# Patient Record
Sex: Male | Born: 1950 | Race: White | Hispanic: No | Marital: Married | Smoking: Never smoker
Health system: Southern US, Community
[De-identification: ages and names within clinical notes are randomized; demographics above are authoritative.]

## PROBLEM LIST (undated history)

## (undated) DIAGNOSIS — Z85828 Personal history of other malignant neoplasm of skin: Secondary | ICD-10-CM

## (undated) DIAGNOSIS — E785 Hyperlipidemia, unspecified: Secondary | ICD-10-CM

## (undated) DIAGNOSIS — T7840XA Allergy, unspecified, initial encounter: Secondary | ICD-10-CM

## (undated) DIAGNOSIS — I10 Essential (primary) hypertension: Secondary | ICD-10-CM

## (undated) DIAGNOSIS — K219 Gastro-esophageal reflux disease without esophagitis: Secondary | ICD-10-CM

## (undated) HISTORY — DX: Hyperlipidemia, unspecified: E78.5

## (undated) HISTORY — PX: NO PAST SURGERIES: SHX2092

## (undated) HISTORY — DX: Allergy, unspecified, initial encounter: T78.40XA

## (undated) HISTORY — DX: Gastro-esophageal reflux disease without esophagitis: K21.9

## (undated) HISTORY — DX: Essential (primary) hypertension: I10

---
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
## 2008-06-23 DIAGNOSIS — R195 Other fecal abnormalities: Secondary | ICD-10-CM | POA: Insufficient documentation

## 2011-08-21 ENCOUNTER — Ambulatory Visit: Payer: Self-pay | Admitting: Family Medicine
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
## 2013-09-17 LAB — BASIC METABOLIC PANEL
BUN: 16 mg/dL (ref 4–21)
Creatinine: 0.9 mg/dL (ref 0.6–1.3)
GLUCOSE: 156 mg/dL
POTASSIUM: 4.7 mmol/L (ref 3.4–5.3)

## 2014-01-28 LAB — CBC AND DIFFERENTIAL
HCT: 45 % (ref 41–53)
HEMOGLOBIN: 15.3 g/dL (ref 13.5–17.5)
PLATELETS: 214 10*3/uL (ref 150–399)
WBC: 5.1 10*3/mL

## 2014-01-28 LAB — LIPID PANEL
CHOLESTEROL: 189 mg/dL (ref 0–200)
HDL: 48 mg/dL (ref 35–70)
LDL Cholesterol: 124 mg/dL
TRIGLYCERIDES: 83 mg/dL (ref 40–160)

## 2014-01-28 LAB — HEMOGLOBIN A1C: Hemoglobin A1C: 6.9

## 2014-01-28 LAB — BASIC METABOLIC PANEL: Sodium: 141 mmol/L (ref 137–147)

## 2014-01-28 LAB — TSH: TSH: 2.08 u[IU]/mL (ref 0.41–5.90)

## 2015-01-17 ENCOUNTER — Other Ambulatory Visit: Payer: Self-pay | Admitting: Family Medicine

## 2015-01-17 DIAGNOSIS — Z8042 Family history of malignant neoplasm of prostate: Secondary | ICD-10-CM | POA: Insufficient documentation

## 2015-01-17 DIAGNOSIS — J309 Allergic rhinitis, unspecified: Secondary | ICD-10-CM | POA: Insufficient documentation

## 2015-01-17 DIAGNOSIS — K219 Gastro-esophageal reflux disease without esophagitis: Secondary | ICD-10-CM | POA: Insufficient documentation

## 2015-01-17 DIAGNOSIS — E119 Type 2 diabetes mellitus without complications: Secondary | ICD-10-CM

## 2015-03-09 DIAGNOSIS — R413 Other amnesia: Secondary | ICD-10-CM | POA: Insufficient documentation

## 2015-03-09 DIAGNOSIS — E781 Pure hyperglyceridemia: Secondary | ICD-10-CM | POA: Insufficient documentation

## 2015-03-09 DIAGNOSIS — E785 Hyperlipidemia, unspecified: Secondary | ICD-10-CM | POA: Insufficient documentation

## 2015-03-09 DIAGNOSIS — R000 Tachycardia, unspecified: Secondary | ICD-10-CM | POA: Insufficient documentation

## 2015-03-10 ENCOUNTER — Encounter: Payer: Self-pay | Admitting: Family Medicine

## 2015-03-10 ENCOUNTER — Ambulatory Visit (INDEPENDENT_AMBULATORY_CARE_PROVIDER_SITE_OTHER): Payer: BC Managed Care – PPO | Admitting: Family Medicine

## 2015-03-10 VITALS — BP 132/76 | HR 92 | Temp 97.7°F | Resp 16 | Ht 68.0 in | Wt 163.0 lb

## 2015-03-10 DIAGNOSIS — E119 Type 2 diabetes mellitus without complications: Secondary | ICD-10-CM | POA: Diagnosis not present

## 2015-03-10 DIAGNOSIS — E785 Hyperlipidemia, unspecified: Secondary | ICD-10-CM | POA: Diagnosis not present

## 2015-03-10 DIAGNOSIS — I10 Essential (primary) hypertension: Secondary | ICD-10-CM | POA: Diagnosis not present

## 2015-03-10 DIAGNOSIS — Z23 Encounter for immunization: Secondary | ICD-10-CM | POA: Diagnosis not present

## 2015-03-10 DIAGNOSIS — Z8042 Family history of malignant neoplasm of prostate: Secondary | ICD-10-CM | POA: Diagnosis not present

## 2015-03-10 DIAGNOSIS — K219 Gastro-esophageal reflux disease without esophagitis: Secondary | ICD-10-CM

## 2015-03-10 DIAGNOSIS — Z0000 Encounter for general adult medical examination without abnormal findings: Secondary | ICD-10-CM | POA: Diagnosis not present

## 2015-03-10 LAB — POCT UA - MICROALBUMIN: MICROALBUMIN (UR) POC: NEGATIVE mg/L

## 2015-03-10 MED ORDER — OMEPRAZOLE 20 MG PO CPDR: 20.0000 mg | DELAYED_RELEASE_CAPSULE | Freq: Two times a day (BID) | ORAL | Status: DC

## 2015-03-10 NOTE — Progress Notes (Signed)
Patient: Ryan Chambers, Male    DOB: 1951-02-12, 65 y.o.   MRN: [id number] Visit Date: 03/10/2015  Today's Provider: Lorie Phenix, MD   Chief Complaint  Patient presents with  . Annual Exam  . Diabetes   Subjective:    Annual physical exam Ryan Chambers is a 65 y.o. male who presents today for health maintenance and complete physical. He feels fairly well. He reports exercising daily; very active with landscaping job. He reports he is sleeping well.  Last OV/CPE was 01/10/2014. Labs were checked 01/28/2014. 10 year risk of heart disease was 24%. Pt started Pravastatin 20 mg. Last PSA- 01/28/2014- 0.4 (family h/o prostate ca) Last Colonoscopy- 10/16/2009 at [facility]; Dr. Earlean Polka. Hyperplastic polyp. Repeat 10 years Last Tdap- 06/03/2011.  ----------------------------------------------------------------- Diabetes Mellitus: Patient presents for follow up of diabetes. Symptoms: none. Symptoms have stabilized. Patient denies foot ulcerations, hypoglycemia , increase appetite, nausea, paresthesia of the feet, polydipsia, polyuria, visual disturbances and weight loss.  Evaluation to date has been included: hemoglobin A1C (last checked on 01/28/2014 and was 6.9%). Home sugars: Fasting BGs range between 105 and 140s. Treatment to date: Metformin 1000 mg BID.     Review of Systems  HENT: Positive for congestion. Negative for dental problem, drooling, ear discharge, ear pain, facial swelling, hearing loss, mouth sores, nosebleeds, postnasal drip, rhinorrhea, sinus pressure, sneezing, sore throat, tinnitus, trouble swallowing and voice change.   All other systems reviewed and are negative.   Social History      He  reports that he has never smoked. He has never used smokeless tobacco. He reports that he does not drink alcohol or use illicit drugs.       Social History   Social History  . Marital Status: Married    Spouse Name: Olegario Messier  . Number of Children: 3  . Years of  Education: college   Occupational History  .  Other    Self Employed- Scientist, water quality   Social History Main Topics  . Smoking status: Never Smoker   . Smokeless tobacco: Never Used  . Alcohol Use: No  . Drug Use: No  . Sexual Activity: Yes   Other Topics Concern  . None   Social History Narrative    Past Medical History  Diagnosis Date  . Allergy   . Hyperlipidemia   . Hypertension   . GERD (gastroesophageal reflux disease)      Patient Active Problem List   Diagnosis Date Noted  . HLD (hyperlipidemia) 03/09/2015  . Hypertriglyceridemia 03/09/2015  . Amnesia 03/09/2015  . Fast heart beat 03/09/2015  . Acid reflux 01/17/2015  . Allergic rhinitis 01/17/2015  . Family history of malignant neoplasm of prostate 01/17/2015  . Abnormal findings in stool 06/23/2008  . Type 2 diabetes mellitus (HCC) 08/13/2006  . BP (high blood pressure) 08/13/2006    Past Surgical History  Procedure Laterality Date  . No past surgeries      Family History        Family Status  Relation Status Death Age  . Father Deceased     pneumonia while on chemo  . Sister Alive   . Mother Deceased     liver problems        His family history includes Atrial fibrillation in his mother; Healthy in his sister; Prostate cancer in his father.    No Known Allergies  Previous Medications   ASPIRIN 81 MG TABLET    Take by mouth.  FLUTICASONE (FLONASE ALLERGY RELIEF) 50 MCG/ACT NASAL SPRAY    Place into the nose. Reported on 03/10/2015   GLUCOSE BLOOD (FREESTYLE LITE) TEST STRIP    FREESTYLE LITE TEST (In Vitro Strip)  1 Strip once daily for 0 days  Quantity: 100;  Refills: 3   Ordered :15-Apr-2013  Lorie Phenix MD;  Started 15-Apr-2013 Active   LOSARTAN (COZAAR) 50 MG TABLET    Take by mouth.   METFORMIN (GLUCOPHAGE) 1000 MG TABLET    TAKE ONE TABLET BY MOUTH TWICE DAILY   MULTIPLE VITAMIN PO    Take by mouth.   PRAVASTATIN (PRAVACHOL) 20 MG TABLET    Take by mouth.   ZINC-VITAMIN C  (ZINC + VITAMIN C) 20-120 MG LOZG    Use as directed in the mouth or throat.    Patient Care Team: Lorie Phenix, MD as PCP - General (Family Medicine)     Objective:   Vitals: BP 132/76 mmHg  Pulse 92  Temp(Src) 97.7 F (36.5 C) (Oral)  Resp 16  Ht  (1.727 m)  Wt 163 lb (73.936 kg)  BMI 24.79 kg/m2   Physical Exam  Constitutional: He appears well-developed and well-nourished.  HENT:  Head: Normocephalic and atraumatic.  Right Ear: External ear normal.  Left Ear: External ear normal.  Nose: Nose normal.  Mouth/Throat: Oropharynx is clear and moist. No oropharyngeal exudate.  Eyes: Conjunctivae and EOM are normal. Pupils are equal, round, and reactive to light.  Neck: Normal range of motion. Neck supple. No thyromegaly present.  Cardiovascular: Normal rate, regular rhythm and intact distal pulses.  Exam reveals no gallop and no friction rub.   No murmur heard. Pulmonary/Chest: Effort normal and breath sounds normal. No respiratory distress. He has no wheezes. He has no rales. He exhibits no tenderness.  Abdominal: Soft. Bowel sounds are normal.  Musculoskeletal: Normal range of motion. He exhibits no edema.  Neurological: He is alert.  Monofilament normal  Skin: Skin is warm and dry.  Psychiatric: He has a normal mood and affect. His behavior is normal.     Depression Screen PHQ 2/9 Scores 03/10/2015  PHQ - 2 Score 0      Assessment & Plan:     Routine Health Maintenance and Physical Exam  Exercise Activities and Dietary recommendations Goals    None      Immunization History  Administered Date(s) Administered  . Hepatitis B 02/01/2011, 03/04/2011, 09/02/2011  . Influenza,inj,Quad PF,36+ Mos 03/10/2015  . Pneumococcal Polysaccharide-23 02/01/2011  . Td 06/03/2011  . Tdap 06/03/2011    Health Maintenance  Topic Date Due  . Hepatitis C Screening  [birth year]  . FOOT EXAM  04/29/1962  . OPHTHALMOLOGY EXAM  04/29/1962  . HIV Screening  04/29/1967    . COLONOSCOPY  04/29/2002  . ZOSTAVAX  04/28/2012  . HEMOGLOBIN A1C  07/30/2014  . INFLUENZA VACCINE  09/19/2014  . PNEUMOCOCCAL POLYSACCHARIDE VACCINE (2) 02/01/2016  . TETANUS/TDAP  06/02/2021      Discussed health benefits of physical activity, and encouraged him to engage in regular exercise appropriate for his age and condition.    --------------------------------------------------------------------  1. Annual physical exam Stable. As above.  2. Type 2 diabetes mellitus without complication, without long-term current use of insulin (HCC) A1C not checked since December, 2015. Order labs as below. Strongly encouraged pt to FU every 6 months, or sooner if needed.  Results for orders placed or performed in visit on 03/10/15  POCT UA - Microalbumin  Result Value Ref  Range   Microalbumin Ur, POC Negative mg/L   Diabetic Foot Exam - Simple   Simple Foot Form  Diabetic Foot exam was performed with the following findings:  Yes 03/10/2015 12:13 PM  Visual Inspection  No deformities, no ulcerations, no other skin breakdown bilaterally:  Yes  Sensation Testing  Intact to touch and monofilament testing bilaterally:  Yes  Pulse Check  Posterior Tibialis and Dorsalis pulse intact bilaterally:  Yes  Comments     - POCT UA - Microalbumin - Hemoglobin A1c  3. Essential hypertension Stable. FU pending lab results. - Comprehensive metabolic panel - CBC with Differential/Platelet  4. HLD (hyperlipidemia) Pt has H/O this. FU pending labs. - Lipid panel - TSH  5. Flu vaccine need Administered today. - Flu Vaccine QUAD 36+ mos IM  6. Family history of malignant neoplasm of prostate FU pending lab results. - PSA  7. Gastroesophageal reflux disease without esophagitis Discontinue Nexium due to insurance. Start Omeprazole 20 mg BID AC as below. - omeprazole (PRILOSEC) 20 MG capsule; Take 1 capsule (20 mg total) by mouth 2 (two) times daily before a meal.  Dispense: 180 capsule;  Refill: 3   Patient seen and examined by Leo Grosser, MD, and note scribed by Allene Dillon, CMA.  I have reviewed the document for accuracy and completeness and I agree with above. Leo Grosser, MD   Lorie Phenix, MD

## 2015-03-17 ENCOUNTER — Encounter: Payer: Self-pay | Admitting: Family Medicine

## 2015-03-21 ENCOUNTER — Telehealth: Payer: Self-pay

## 2015-03-21 LAB — COMPREHENSIVE METABOLIC PANEL
A/G RATIO: 2.2 (ref 1.1–2.5)
ALT: 20 IU/L (ref 0–44)
AST: 16 IU/L (ref 0–40)
Albumin: 4.3 g/dL (ref 3.6–4.8)
Alkaline Phosphatase: 78 IU/L (ref 39–117)
BUN/Creatinine Ratio: 14 (ref 10–22)
BUN: 14 mg/dL (ref 8–27)
Bilirubin Total: 0.6 mg/dL (ref 0.0–1.2)
CALCIUM: 9.4 mg/dL (ref 8.6–10.2)
CO2: 23 mmol/L (ref 18–29)
Chloride: 100 mmol/L (ref 96–106)
Creatinine, Ser: 0.98 mg/dL (ref 0.76–1.27)
GFR, EST AFRICAN AMERICAN: 95 mL/min/{1.73_m2} (ref >59)
GFR, EST NON AFRICAN AMERICAN: 82 mL/min/{1.73_m2} (ref >59)
GLOBULIN, TOTAL: 2 g/dL (ref 1.5–4.5)
Glucose: 180 mg/dL — ABNORMAL HIGH (ref 65–99)
POTASSIUM: 3.9 mmol/L (ref 3.5–5.2)
SODIUM: 141 mmol/L (ref 134–144)
TOTAL PROTEIN: 6.3 g/dL (ref 6.0–8.5)

## 2015-03-21 LAB — CBC WITH DIFFERENTIAL/PLATELET
BASOS: 0 %
Basophils Absolute: 0 10*3/uL (ref 0.0–0.2)
EOS (ABSOLUTE): 0.1 10*3/uL (ref 0.0–0.4)
Eos: 2 %
Hematocrit: 42 % (ref 37.5–51.0)
Hemoglobin: 14.6 g/dL (ref 12.6–17.7)
Immature Grans (Abs): 0 10*3/uL (ref 0.0–0.1)
Immature Granulocytes: 1 %
LYMPHS ABS: 2 10*3/uL (ref 0.7–3.1)
Lymphs: 30 %
MCH: 29.6 pg (ref 26.6–33.0)
MCHC: 34.8 g/dL (ref 31.5–35.7)
MCV: 85 fL (ref 79–97)
MONOS ABS: 0.5 10*3/uL (ref 0.1–0.9)
Monocytes: 8 %
NEUTROS ABS: 3.9 10*3/uL (ref 1.4–7.0)
Neutrophils: 59 %
PLATELETS: 199 10*3/uL (ref 150–379)
RBC: 4.94 x10E6/uL (ref 4.14–5.80)
RDW: 13.7 % (ref 12.3–15.4)
WBC: 6.6 10*3/uL (ref 3.4–10.8)

## 2015-03-21 LAB — PSA: PROSTATE SPECIFIC AG, SERUM: 0.6 ng/mL (ref 0.0–4.0)

## 2015-03-21 LAB — HEMOGLOBIN A1C
Est. average glucose Bld gHb Est-mCnc: 174 mg/dL
Hgb A1c MFr Bld: 7.7 % — ABNORMAL HIGH (ref 4.8–5.6)

## 2015-03-21 LAB — LIPID PANEL
CHOL/HDL RATIO: 3.9 ratio (ref 0.0–5.0)
Cholesterol, Total: 139 mg/dL (ref 100–199)
HDL: 36 mg/dL — ABNORMAL LOW (ref >39)
LDL CALC: 79 mg/dL (ref 0–99)
TRIGLYCERIDES: 119 mg/dL (ref 0–149)
VLDL Cholesterol Cal: 24 mg/dL (ref 5–40)

## 2015-03-21 LAB — TSH: TSH: 2.2 u[IU]/mL (ref 0.450–4.500)

## 2015-03-21 MED ORDER — SITAGLIPTIN PHOSPHATE 100 MG PO TABS: 100.0000 mg | ORAL_TABLET | Freq: Every day | ORAL | Status: DC

## 2015-03-21 NOTE — Telephone Encounter (Signed)
-----   Message from Lorie Phenix, MD sent at 03/21/2015  7:11 AM EST ----- Labs stable except for blood sugar much too high. Recommend start Januvia and recheck ov in 3 months.  Please send in rx if patient agrees. Thanks.

## 2015-03-21 NOTE — Telephone Encounter (Signed)
Patient advised as below. Patient agrees to starting Januvia. Patient is requesting rx to be sent into [facility]

## 2015-04-18 ENCOUNTER — Other Ambulatory Visit: Payer: Self-pay | Admitting: Family Medicine

## 2015-04-18 DIAGNOSIS — E785 Hyperlipidemia, unspecified: Secondary | ICD-10-CM

## 2015-05-15 ENCOUNTER — Other Ambulatory Visit: Payer: Self-pay | Admitting: Family Medicine

## 2015-05-15 DIAGNOSIS — I10 Essential (primary) hypertension: Secondary | ICD-10-CM

## 2015-06-16 ENCOUNTER — Ambulatory Visit (INDEPENDENT_AMBULATORY_CARE_PROVIDER_SITE_OTHER): Payer: BC Managed Care – PPO | Admitting: Family Medicine

## 2015-06-16 ENCOUNTER — Encounter: Payer: Self-pay | Admitting: Family Medicine

## 2015-06-16 VITALS — BP 120/72 | HR 92 | Temp 97.6°F | Resp 16 | Wt 162.0 lb

## 2015-06-16 DIAGNOSIS — I10 Essential (primary) hypertension: Secondary | ICD-10-CM | POA: Diagnosis not present

## 2015-06-16 DIAGNOSIS — E785 Hyperlipidemia, unspecified: Secondary | ICD-10-CM

## 2015-06-16 DIAGNOSIS — E119 Type 2 diabetes mellitus without complications: Secondary | ICD-10-CM

## 2015-06-16 DIAGNOSIS — K219 Gastro-esophageal reflux disease without esophagitis: Secondary | ICD-10-CM | POA: Diagnosis not present

## 2015-06-16 LAB — POCT GLYCOSYLATED HEMOGLOBIN (HGB A1C)
Est. average glucose Bld gHb Est-mCnc: 143
Hemoglobin A1C: 6.6

## 2015-06-16 MED ORDER — GLUCOSE BLOOD VI STRP: 1.0000 | ORAL_STRIP | Freq: Every day | Status: DC

## 2015-06-16 MED ORDER — METFORMIN HCL 1000 MG PO TABS: 1000.0000 mg | ORAL_TABLET | Freq: Two times a day (BID) | ORAL | Status: DC

## 2015-06-16 MED ORDER — PRAVASTATIN SODIUM 20 MG PO TABS: 20.0000 mg | ORAL_TABLET | Freq: Every evening | ORAL | Status: DC

## 2015-06-16 MED ORDER — LOSARTAN POTASSIUM 50 MG PO TABS: 50.0000 mg | ORAL_TABLET | Freq: Every day | ORAL | Status: DC

## 2015-06-16 MED ORDER — OMEPRAZOLE 20 MG PO CPDR: 20.0000 mg | DELAYED_RELEASE_CAPSULE | Freq: Two times a day (BID) | ORAL | Status: DC

## 2015-06-16 MED ORDER — SITAGLIPTIN PHOSPHATE 100 MG PO TABS: 100.0000 mg | ORAL_TABLET | Freq: Every day | ORAL | Status: DC

## 2015-06-16 NOTE — Progress Notes (Signed)
Subjective:    Patient ID: Ryan Chambers, male    DOB: 1950/03/31, 65 y.o.   MRN: [id number]  Diabetes He presents for his follow-up (Last A1C was 03/20/2015 and was 7.7%. Januvia started since LOV) diabetic visit. He has type 2 diabetes mellitus. There are no hypoglycemic associated symptoms. Associated symptoms include fatigue (stable). Pertinent negatives for diabetes include no blurred vision, no chest pain, no foot paresthesias, no foot ulcerations, no polydipsia, no polyphagia, no polyuria, no visual change and no weakness. Symptoms are stable. Risk factors for coronary artery disease include diabetes mellitus, dyslipidemia, hypertension, male sex and family history. Current diabetic treatment includes oral agent (dual therapy) (Metformin 1000 mg BID, Januvia 100 mg po qd). He is compliant with treatment all of the time. He is following a generally healthy diet. He participates in exercise daily. Home blood sugar record trend: have not been checking BS due to being out strips. An ACE inhibitor/angiotensin II receptor blocker is being taken. Eye exam is current.  Hypertension This is a chronic problem. The problem is controlled. Pertinent negatives include no blurred vision, chest pain, neck pain, orthopnea, palpitations, peripheral edema or shortness of breath. Treatments tried: currently taking Losartan 50 mg. The current treatment provides moderate improvement. There are no compliance problems.       Review of Systems  Constitutional: Positive for fatigue (stable).  Eyes: Negative for blurred vision.  Respiratory: Negative for shortness of breath.   Cardiovascular: Negative for chest pain, palpitations and orthopnea.  Endocrine: Negative for polydipsia, polyphagia and polyuria.  Musculoskeletal: Negative for neck pain.  Neurological: Negative for weakness.   BP 120/72 mmHg  Pulse 92  Temp(Src) 97.6 F (36.4 C) (Oral)  Resp 16  Wt 162 lb (73.483 kg)   Patient Active Problem List   Diagnosis Date Noted  . HLD (hyperlipidemia) 03/09/2015  . Hypertriglyceridemia 03/09/2015  . Amnesia 03/09/2015  . Fast heart beat 03/09/2015  . Acid reflux 01/17/2015  . Allergic rhinitis 01/17/2015  . Family history of malignant neoplasm of prostate 01/17/2015  . Abnormal findings in stool 06/23/2008  . DM type 2 (diabetes mellitus, type 2) (HCC) 08/13/2006  . BP (high blood pressure) 08/13/2006   Past Medical History  Diagnosis Date  . Allergy   . Hyperlipidemia   . Hypertension   . GERD (gastroesophageal reflux disease)    Current Outpatient Prescriptions on File Prior to Visit  Medication Sig  . aspirin 81 MG tablet Take by mouth.  . losartan (COZAAR) 50 MG tablet TAKE 1 TABLET BY MOUTH EVERY DAY  . metFORMIN (GLUCOPHAGE) 1000 MG tablet TAKE ONE TABLET BY MOUTH TWICE DAILY  . MULTIPLE VITAMIN PO Take by mouth.  Marland Kitchen omeprazole (PRILOSEC) 20 MG capsule Take 1 capsule (20 mg total) by mouth 2 (two) times daily before a meal.  . pravastatin (PRAVACHOL) 20 MG tablet TAKE ONE TABLET BY MOUTH IN THE EVENING  . sitaGLIPtin (JANUVIA) 100 MG tablet Take 1 tablet (100 mg total) by mouth daily.  . Zinc-Vitamin C (ZINC + VITAMIN C) 20-120 MG LOZG Use as directed in the mouth or throat.  . fluticasone (FLONASE ALLERGY RELIEF) 50 MCG/ACT nasal spray Place into the nose. Reported on 06/16/2015   No current facility-administered medications on file prior to visit.   No Known Allergies Past Surgical History  Procedure Laterality Date  . No past surgeries     Social History   Social History  . Marital Status: Married    Spouse Name: Olegario Messier  .  Number of Children: 3  . Years of Education: college   Occupational History  .  Other    Self Employed- Scientist, [employer]   Social History Main Topics  . Smoking status: Never Smoker   . Smokeless tobacco: Never Used  . Alcohol Use: No  . Drug Use: No  . Sexual Activity: Yes   Other Topics Concern  . Not on file   Social History  Narrative   Family History  Problem Relation Age of Onset  . Prostate cancer Father   . Healthy Sister   . Atrial fibrillation Mother         Objective:   Physical Exam  Constitutional: He appears well-developed and well-nourished.  Cardiovascular: Normal rate, regular rhythm and normal heart sounds.   Pulmonary/Chest: Effort normal and breath sounds normal. No respiratory distress.  Psychiatric: He has a normal mood and affect. His behavior is normal.  BP 120/72 mmHg  Pulse 92  Temp(Src) 97.6 F (36.4 C) (Oral)  Resp 16  Wt 162 lb (73.483 kg)     Assessment & Plan:  1. Type 2 diabetes mellitus without complication, without long-term current use of insulin (HCC) Improving. Continue current medications and plan of care. Refill as below.  Results for orders placed or performed in visit on 06/16/15  POCT glycosylated hemoglobin (Hb A1C)  Result Value Ref Range   Hemoglobin A1C 6.6    Est. average glucose Bld gHb Est-mCnc 143     - POCT glycosylated hemoglobin (Hb A1C) - glucose blood (FREESTYLE LITE) test strip; 1 each by Other route daily. Use as instructed  Dispense: 100 each; Refill: 11 - metFORMIN (GLUCOPHAGE) 1000 MG tablet; Take 1 tablet (1,000 mg total) by mouth 2 (two) times daily.  Dispense: 180 tablet; Refill: 2 - sitaGLIPtin (JANUVIA) 100 MG tablet; Take 1 tablet (100 mg total) by mouth daily.  Dispense: 90 tablet; Refill: 2  2. Essential hypertension Stable. Continue current medications and plan of care. Refills provided. - losartan (COZAAR) 50 MG tablet; Take 1 tablet (50 mg total) by mouth daily.  Dispense: 90 tablet; Refill: 2  3. HLD (hyperlipidemia) Stable. Continue current medications and plan of care. Refills provided. - pravastatin (PRAVACHOL) 20 MG tablet; Take 1 tablet (20 mg total) by mouth every evening.  Dispense: 90 tablet; Refill: 2  4. Gastroesophageal reflux disease without esophagitis Stable. Continue current medications and plan of care.  Refills provided. - omeprazole (PRILOSEC) 20 MG capsule; Take 1 capsule (20 mg total) by mouth 2 (two) times daily before a meal.  Dispense: 180 capsule; Refill: 2    Patient seen and examined by Leo GrosserNancy J. Keenya Matera, MD, and note scribed by Allene DillonEmily Drozdowski, CMA.   I have reviewed the document for accuracy and completeness and I agree with above. Leo Grosser- Shakeda Pearse J. Ladonya Jerkins, MD   Lorie PhenixNancy Tilla Wilborn, MD

## 2015-09-08 ENCOUNTER — Ambulatory Visit: Payer: BC Managed Care – PPO | Admitting: Family Medicine

## 2015-12-04 ENCOUNTER — Ambulatory Visit (INDEPENDENT_AMBULATORY_CARE_PROVIDER_SITE_OTHER): Payer: BC Managed Care – PPO | Admitting: Family Medicine

## 2015-12-04 ENCOUNTER — Encounter: Payer: Self-pay | Admitting: Family Medicine

## 2015-12-04 VITALS — BP 128/82 | HR 94 | Temp 97.6°F | Resp 16 | Wt 162.8 lb

## 2015-12-04 DIAGNOSIS — E782 Mixed hyperlipidemia: Secondary | ICD-10-CM

## 2015-12-04 DIAGNOSIS — E119 Type 2 diabetes mellitus without complications: Secondary | ICD-10-CM

## 2015-12-04 DIAGNOSIS — I10 Essential (primary) hypertension: Secondary | ICD-10-CM | POA: Diagnosis not present

## 2015-12-04 NOTE — Progress Notes (Signed)
Patient: Ryan Chambers Male    DOB: [birth year]   65 y.o.   MRN: [id number] Visit Date: 12/04/2015  Today's Provider: Dortha Kernennis Skye Rodarte, PA   Chief Complaint  Patient presents with  . Hyperlipidemia  . Hypertension  . Diabetes  . Follow-up   Subjective:    HPI Patient is here for follow up and establish care with Maurine Ministerennis since Dr Elease HashimotoMaloney left. Patient is doing well on current medication regimen.     Diabetes Mellitus Type II, Follow-up:   Lab Results  Component Value Date   HGBA1C 6.6 06/16/2015   HGBA1C 7.7 (H) 03/20/2015   HGBA1C 6.9 01/28/2014   Last seen for diabetes 6 months ago.  Management since then includes continue medication. He reports excellent compliance with treatment. He is not having side effects.  Current symptoms include none and have been stable. Home blood sugar records: not being checked regularly  Episodes of hypoglycemia? unknown   Weight trend: stable Current diet: in general, a "healthy" diet   Current exercise: yard work  ------------------------------------------------------------------------   Hypertension, follow-up:  BP Readings from Last 3 Encounters:  12/04/15 128/82  06/16/15 120/72  03/10/15 132/76    He was last seen for hypertension 6 months ago.  BP at that visit was 120/72. Management since that visit includes continue medication.He reports excellent compliance with treatment. He is not having side effects.  He is exercising. He is adherent to low salt diet.   Outside blood pressures are not being checked. He is experiencing none.  Patient denies chest pain, irregular heart beat and palpitations.   Cardiovascular risk factors include advanced age (older than 3455 for men, 5765 for women), diabetes mellitus, dyslipidemia, hypertension and male gender.  Use of agents associated with hypertension: none.   ------------------------------------------------------------------------    Lipid/Cholesterol, Follow-up:   Last seen  for this 6 months ago.  Management since that visit includes continue medication.  Last Lipid Panel:    Component Value Date/Time   CHOL 139 03/20/2015 0829   TRIG 119 03/20/2015 0829   HDL 36 (L) 03/20/2015 0829   CHOLHDL 3.9 03/20/2015 0829   LDLCALC 79 03/20/2015 0829    He reports excellent compliance with treatment. He is not having side effects.   Wt Readings from Last 3 Encounters:  12/04/15 162 lb 12.8 oz (73.8 kg)  06/16/15 162 lb (73.5 kg)  03/10/15 163 lb (73.9 kg)    ------------------------------------------------------------------------  Previous Medications   ASPIRIN 81 MG TABLET    Take by mouth.   FLUTICASONE (FLONASE ALLERGY RELIEF) 50 MCG/ACT NASAL SPRAY    Place into the nose. Reported on 06/16/2015   GLUCOSE BLOOD (FREESTYLE LITE) TEST STRIP    1 each by Other route daily. Use as instructed   LOSARTAN (COZAAR) 50 MG TABLET    Take 1 tablet (50 mg total) by mouth daily.   METFORMIN (GLUCOPHAGE) 1000 MG TABLET    Take 1 tablet (1,000 mg total) by mouth 2 (two) times daily.   MULTIPLE VITAMIN PO    Take by mouth.   OMEPRAZOLE (PRILOSEC) 20 MG CAPSULE    Take 1 capsule (20 mg total) by mouth 2 (two) times daily before a meal.   PRAVASTATIN (PRAVACHOL) 20 MG TABLET    Take 1 tablet (20 mg total) by mouth every evening.   SITAGLIPTIN (JANUVIA) 100 MG TABLET    Take 1 tablet (100 mg total) by mouth daily.   ZINC-VITAMIN C (ZINC + VITAMIN C) 20-120 MG LOZG  Use as directed in the mouth or throat.    Review of Systems  Constitutional: Negative.   Respiratory: Negative.   Cardiovascular: Negative.   Endocrine: Negative.   Musculoskeletal: Negative.     Social History  Substance Use Topics  . Smoking status: Never Smoker  . Smokeless tobacco: Never Used  . Alcohol use No   Objective:   BP 128/82 (BP Location: Right Arm, Patient Position: Sitting, Cuff Size: Normal)   Pulse 94   Temp 97.6 F (36.4 C) (Oral)   Resp 16   Wt 162 lb 12.8 oz (73.8 kg)    BMI 24.75 kg/m   Physical Exam  Constitutional: He is oriented to person, place, and time. He appears well-developed and well-nourished. No distress.  HENT:  Head: Normocephalic and atraumatic.  Right Ear: Hearing normal.  Left Ear: Hearing normal.  Nose: Nose normal.  Eyes: Conjunctivae and lids are normal. Right eye exhibits no discharge. Left eye exhibits no discharge. No scleral icterus.  Neck: Neck supple. No thyromegaly present.  Cardiovascular: Normal rate and regular rhythm.   Pulmonary/Chest: Effort normal and breath sounds normal. No respiratory distress.  Abdominal: Soft. Bowel sounds are normal.  Musculoskeletal: Normal range of motion.  Neurological: He is alert and oriented to person, place, and time.  Skin: Skin is intact. No lesion and no rash noted.  Psychiatric: He has a normal mood and affect. His speech is normal and behavior is normal. Thought content normal.      Assessment & Plan:     1. Type 2 diabetes mellitus without complication, without long-term current use of insulin (HCC) Stable. Tolerating Januvia and Metformin without hypoglycemic episodes. Had eye exam March 2017. Monofilament exam and microalbuminuria tests were normal 03-10-15. 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 Recheck labs and schedule physical in 3-4 months. - CBC with Differential/Platelet - Comprehensive metabolic panel - Lipid panel - Hemoglobin A1c  2. Mixed hyperlipidemia Stable and tolerating Pravastatin 20 mg qd with low fat diet restrictions. Recheck labs and follow up pending reports. Denies side effects from medications. - Comprehensive metabolic panel - Lipid panel - TSH  3. Essential hypertension Well controlled BP. Tolerating Losartan 50 mg qd without chest pains, palpitations, dyspnea or edema. Recheck labs and follow up pending reports.

## 2015-12-13 ENCOUNTER — Telehealth: Payer: Self-pay

## 2015-12-13 LAB — CBC WITH DIFFERENTIAL/PLATELET
BASOS: 0 %
Basophils Absolute: 0 10*3/uL (ref 0.0–0.2)
EOS (ABSOLUTE): 0.1 10*3/uL (ref 0.0–0.4)
EOS: 1 %
Hematocrit: 42.9 % (ref 37.5–51.0)
Hemoglobin: 14.5 g/dL (ref 12.6–17.7)
IMMATURE GRANS (ABS): 0 10*3/uL (ref 0.0–0.1)
IMMATURE GRANULOCYTES: 1 %
LYMPHS: 31 %
Lymphocytes Absolute: 1.9 10*3/uL (ref 0.7–3.1)
MCH: 28.8 pg (ref 26.6–33.0)
MCHC: 33.8 g/dL (ref 31.5–35.7)
MCV: 85 fL (ref 79–97)
Monocytes Absolute: 0.5 10*3/uL (ref 0.1–0.9)
Monocytes: 8 %
NEUTROS PCT: 59 %
Neutrophils Absolute: 3.7 10*3/uL (ref 1.4–7.0)
PLATELETS: 202 10*3/uL (ref 150–379)
RBC: 5.03 x10E6/uL (ref 4.14–5.80)
RDW: 13.9 % (ref 12.3–15.4)
WBC: 6.3 10*3/uL (ref 3.4–10.8)

## 2015-12-13 LAB — LIPID PANEL
CHOL/HDL RATIO: 3.7 ratio (ref 0.0–5.0)
Cholesterol, Total: 151 mg/dL (ref 100–199)
HDL: 41 mg/dL (ref >39)
LDL CALC: 86 mg/dL (ref 0–99)
Triglycerides: 122 mg/dL (ref 0–149)
VLDL CHOLESTEROL CAL: 24 mg/dL (ref 5–40)

## 2015-12-13 LAB — COMPREHENSIVE METABOLIC PANEL
A/G RATIO: 2.4 — AB (ref 1.2–2.2)
ALT: 16 IU/L (ref 0–44)
AST: 16 IU/L (ref 0–40)
Albumin: 4.3 g/dL (ref 3.6–4.8)
Alkaline Phosphatase: 69 IU/L (ref 39–117)
BUN/Creatinine Ratio: 14 (ref 10–24)
BUN: 12 mg/dL (ref 8–27)
Bilirubin Total: 0.4 mg/dL (ref 0.0–1.2)
CALCIUM: 10.2 mg/dL (ref 8.6–10.2)
CO2: 25 mmol/L (ref 18–29)
CREATININE: 0.86 mg/dL (ref 0.76–1.27)
Chloride: 100 mmol/L (ref 96–106)
GFR, EST AFRICAN AMERICAN: 107 mL/min/{1.73_m2} (ref >59)
GFR, EST NON AFRICAN AMERICAN: 92 mL/min/{1.73_m2} (ref >59)
Globulin, Total: 1.8 g/dL (ref 1.5–4.5)
Glucose: 161 mg/dL — ABNORMAL HIGH (ref 65–99)
Potassium: 4.5 mmol/L (ref 3.5–5.2)
Sodium: 140 mmol/L (ref 134–144)
TOTAL PROTEIN: 6.1 g/dL (ref 6.0–8.5)

## 2015-12-13 LAB — TSH: TSH: 2.47 u[IU]/mL (ref 0.450–4.500)

## 2015-12-13 LAB — HEMOGLOBIN A1C
Est. average glucose Bld gHb Est-mCnc: 157 mg/dL
HEMOGLOBIN A1C: 7.1 % — AB (ref 4.8–5.6)

## 2015-12-13 NOTE — Telephone Encounter (Signed)
LMTCB

## 2015-12-13 NOTE — Telephone Encounter (Signed)
-----   Message from Tamsen Roersennis E Chrismon, [place] sent at 12/13/2015  2:11 PM EDT ----- Very good cholesterol levels. Blood sugar elevated but better than last time. Hgb A1C slightly higher than last check but in diabetes control range. Normal thyroid and now sign of anemia or infection. Continue present medication and diet regimen. Schedule for CPE in the next 3-4 months.

## 2015-12-13 NOTE — Telephone Encounter (Signed)
Patient advised. Patient has appointment scheduled for CPE.

## 2016-01-25 DIAGNOSIS — Z85828 Personal history of other malignant neoplasm of skin: Secondary | ICD-10-CM

## 2016-01-25 HISTORY — DX: Personal history of other malignant neoplasm of skin: Z85.828

## 2016-03-11 ENCOUNTER — Ambulatory Visit (INDEPENDENT_AMBULATORY_CARE_PROVIDER_SITE_OTHER): Payer: BC Managed Care – PPO | Admitting: Family Medicine

## 2016-03-11 ENCOUNTER — Encounter: Payer: Self-pay | Admitting: Family Medicine

## 2016-03-11 VITALS — BP 138/78 | HR 100 | Temp 98.0°F | Resp 18 | Ht 68.0 in | Wt 161.4 lb

## 2016-03-11 DIAGNOSIS — Z0000 Encounter for general adult medical examination without abnormal findings: Secondary | ICD-10-CM | POA: Diagnosis not present

## 2016-03-11 DIAGNOSIS — Z1211 Encounter for screening for malignant neoplasm of colon: Secondary | ICD-10-CM | POA: Diagnosis not present

## 2016-03-11 DIAGNOSIS — E119 Type 2 diabetes mellitus without complications: Secondary | ICD-10-CM | POA: Diagnosis not present

## 2016-03-11 DIAGNOSIS — S46911A Strain of unspecified muscle, fascia and tendon at shoulder and upper arm level, right arm, initial encounter: Secondary | ICD-10-CM

## 2016-03-11 DIAGNOSIS — I10 Essential (primary) hypertension: Secondary | ICD-10-CM

## 2016-03-11 DIAGNOSIS — Z8042 Family history of malignant neoplasm of prostate: Secondary | ICD-10-CM | POA: Diagnosis not present

## 2016-03-11 DIAGNOSIS — E782 Mixed hyperlipidemia: Secondary | ICD-10-CM | POA: Diagnosis not present

## 2016-03-11 DIAGNOSIS — Z114 Encounter for screening for human immunodeficiency virus [HIV]: Secondary | ICD-10-CM

## 2016-03-11 DIAGNOSIS — Z1159 Encounter for screening for other viral diseases: Secondary | ICD-10-CM

## 2016-03-11 LAB — IFOBT (OCCULT BLOOD): IFOBT: NEGATIVE

## 2016-03-11 LAB — POCT UA - MICROALBUMIN: Microalbumin Ur, POC: 20 mg/L

## 2016-03-11 NOTE — Progress Notes (Signed)
Patient: Ryan Chambers, Male    DOB: [birth year], 66 y.o.   MRN: [id number] Visit Date: 03/11/2016  Today's Provider: Dortha Kern, PA   Chief Complaint  Patient presents with  . Annual Exam   Subjective:    Annual physical exam Ryan Chambers is a 66 y.o. male who presents today for health maintenance and complete physical. He feels fairly well. He reports exercising. He reports he is sleeping fair 5-6 hours per night. Patient would like to discuss sleep disturbance.  ---------------------------------------------------------------- Patient Active Problem List   Diagnosis Date Noted  . HLD (hyperlipidemia) 03/09/2015  . Hypertriglyceridemia 03/09/2015  . Amnesia 03/09/2015  . Fast heart beat 03/09/2015  . Acid reflux 01/17/2015  . Allergic rhinitis 01/17/2015  . Family history of malignant neoplasm of prostate 01/17/2015  . Abnormal findings in stool 06/23/2008  . DM type 2 (diabetes mellitus, type 2) (HCC) 08/13/2006  . BP (high blood pressure) 08/13/2006    Review of Systems  Constitutional: Negative.   Respiratory: Negative.   Cardiovascular: Negative.   Musculoskeletal:       Right shoulder pain   Psychiatric/Behavioral: Positive for sleep disturbance.    Social History      He  reports that he has never smoked. He has never used smokeless tobacco. He reports that he does not drink alcohol or use drugs.       Social History   Social History  . Marital status: Married    Spouse name: Olegario Messier  . Number of children: 3  . Years of education: college   Occupational History  .  Other    Self Employed- Scientist, water quality   Social History Main Topics  . Smoking status: Never Smoker  . Smokeless tobacco: Never Used  . Alcohol use No  . Drug use: No  . Sexual activity: Yes   Other Topics Concern  . None   Social History Narrative  . None    Past Medical History:  Diagnosis Date  . Allergy   . GERD (gastroesophageal reflux disease)   . Hyperlipidemia     . Hypertension      Patient Active Problem List   Diagnosis Date Noted  . HLD (hyperlipidemia) 03/09/2015  . Hypertriglyceridemia 03/09/2015  . Amnesia 03/09/2015  . Fast heart beat 03/09/2015  . Acid reflux 01/17/2015  . Allergic rhinitis 01/17/2015  . Family history of malignant neoplasm of prostate 01/17/2015  . Abnormal findings in stool 06/23/2008  . DM type 2 (diabetes mellitus, type 2) (HCC) 08/13/2006  . BP (high blood pressure) 08/13/2006    Past Surgical History:  Procedure Laterality Date  . NO PAST SURGERIES      Family History        Family Status  Relation Status  . Father Deceased   pneumonia while on chemo  . Sister Alive  . Mother Deceased   liver problems        His family history includes Atrial fibrillation in his mother; Healthy in his sister; Prostate cancer in his father.     No Known Allergies   Current Outpatient Prescriptions:  .  aspirin 81 MG tablet, Take by mouth., Disp: , Rfl:  .  fluticasone (FLONASE ALLERGY RELIEF) 50 MCG/ACT nasal spray, Place into the nose. Reported on 06/16/2015, Disp: , Rfl:  .  glucose blood (FREESTYLE LITE) test strip, 1 each by Other route daily. Use as instructed, Disp: 100 each, Rfl: 11 .  losartan (COZAAR) 50 MG tablet, Take  1 tablet (50 mg total) by mouth daily., Disp: 90 tablet, Rfl: 2 .  metFORMIN (GLUCOPHAGE) 1000 MG tablet, Take 1 tablet (1,000 mg total) by mouth 2 (two) times daily., Disp: 180 tablet, Rfl: 2 .  MULTIPLE VITAMIN PO, Take by mouth., Disp: , Rfl:  .  omeprazole (PRILOSEC) 20 MG capsule, Take 1 capsule (20 mg total) by mouth 2 (two) times daily before a meal., Disp: 180 capsule, Rfl: 2 .  pravastatin (PRAVACHOL) 20 MG tablet, Take 1 tablet (20 mg total) by mouth every evening., Disp: 90 tablet, Rfl: 2 .  sitaGLIPtin (JANUVIA) 100 MG tablet, Take 1 tablet (100 mg total) by mouth daily., Disp: 90 tablet, Rfl: 2 .  Zinc-Vitamin C (ZINC + VITAMIN C) 20-120 MG LOZG, Use as directed in the mouth  or throat., Disp: , Rfl:    Patient Care Team: Lorie PhenixNancy Maloney, MD as PCP - General (Family Medicine)      Objective:   Vitals: BP 138/78 (BP Location: Right Arm, Patient Position: Sitting, Cuff Size: Normal)   Pulse 100   Temp 98 F (36.7 C) (Oral)   Resp 18   Ht 5\' 8"  (1.727 m)   Wt 161 lb 6.4 oz (73.2 kg)   SpO2 99%   BMI 24.54 kg/m    Physical Exam  Constitutional: He is oriented to person, place, and time. He appears well-developed and well-nourished.  HENT:  Head: Normocephalic and atraumatic.  Right Ear: External ear normal.  Left Ear: External ear normal.  Nose: Nose normal.  Mouth/Throat: Oropharynx is clear and moist.  Eyes: Conjunctivae and EOM are normal. Pupils are equal, round, and reactive to light. Right eye exhibits no discharge.  Neck: Normal range of motion. Neck supple. No tracheal deviation present. No thyromegaly present.  Cardiovascular: Normal rate, regular rhythm, normal heart sounds and intact distal pulses.   No murmur heard. Pulmonary/Chest: Effort normal and breath sounds normal. No respiratory distress. He has no wheezes. He has no rales. He exhibits no tenderness.  Abdominal: Soft. He exhibits no distension and no mass. There is no tenderness. There is no rebound and no guarding.  Musculoskeletal: Normal range of motion. He exhibits no edema or tenderness.  Some yellowing and thickening of great toenails. Soreness in the right trapezius muscle with activities at work and reaching over head.   Lymphadenopathy:    He has no cervical adenopathy.  Neurological: He is alert and oriented to person, place, and time. He has normal reflexes. No cranial nerve deficit. He exhibits normal muscle tone. Coordination normal.  Skin: Skin is warm and dry. No rash noted. No erythema.  Psychiatric: He has a normal mood and affect. His behavior is normal. Judgment and thought content normal.   Depression Screen PHQ 2/9 Scores 03/10/2015  PHQ - 2 Score 0     Assessment & Plan:     Routine Health Maintenance and Physical Exam  Exercise Activities and Dietary recommendations Goals    Recommend 30 minute workout 3-4 days a week with low fat diabetic diet.      Immunization History  Administered Date(s) Administered  . Hepatitis B 02/01/2011, 03/04/2011, 09/02/2011  . Influenza Split 11/14/2006, 12/13/2011, 12/19/2012  . Influenza,inj,Quad PF,36+ Mos 03/10/2015  . Pneumococcal Polysaccharide-23 02/01/2011  . Td 06/03/2011  . Tdap 06/03/2011    Health Maintenance  Topic Date Due  . Hepatitis C Screening  September 14, 1950  . HIV Screening  04/29/1967  . ZOSTAVAX  04/28/2012  . INFLUENZA VACCINE  09/19/2015  .  PNEUMOCOCCAL POLYSACCHARIDE VACCINE (2) 02/01/2016  . FOOT EXAM  03/09/2016  . OPHTHALMOLOGY EXAM  05/14/2016  . HEMOGLOBIN A1C  06/11/2016  . COLONOSCOPY  10/17/2019  . TETANUS/TDAP  06/02/2021     Discussed health benefits of physical activity, and encouraged him to engage in regular exercise appropriate for his age and condition.    -------------------------------------------------------------------- 1. Annual physical exam Good general health. Colonoscopy up to date. Wants to check with his insurance regarding Zostavax coverage and get flu with shingles vaccination at his pharmacy. Given anticipatory guidance.  2. Essential hypertension Tolerating Losartan without side effects. Very good BP control. Recheck labs and follow up pending reports. - CBC with Differential/Platelet - Comprehensive metabolic panel - Lipid panel - TSH  3. Type 2 diabetes mellitus without complication, without long-term current use of insulin (HCC) Good foot exam and tolerating Januvia and Metformin but admits to poor compliance with diet and medications.  Will recheck labs and encouraged to continue medications. Will get eye exam in March 2018. Normal monofilament exam today. Will check micro-albumin in urine today. - CBC with  Differential/Platelet - Comprehensive metabolic panel - Lipid panel - Hemoglobin A1c  4. Family history of malignant neoplasm of prostate Father had prostate cancer. DRE today shows slight enlargement of prostate but no nodule. Recheck PSA. - PSA  5. Mixed hyperlipidemia Tolerating Pravastatin and trying to stay on the low fat diabetic diet. Will recheck labs and continue present dosage of Pravastatin. - Comprehensive metabolic panel - Lipid panel - TSH  6. Need for hepatitis C screening test - Hepatitis C antibody  7. Encounter for screening for HIV - HIV antibody  8. 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 May use Aspercreme with Lidocaine or Advil prn. Apply moist heat prn.    Dortha Kern, PA  [facility]

## 2016-03-12 ENCOUNTER — Other Ambulatory Visit: Payer: Self-pay | Admitting: Family Medicine

## 2016-03-12 DIAGNOSIS — E119 Type 2 diabetes mellitus without complications: Secondary | ICD-10-CM

## 2016-03-19 LAB — CBC WITH DIFFERENTIAL/PLATELET
BASOS: 0 %
Basophils Absolute: 0 10*3/uL (ref 0.0–0.2)
EOS (ABSOLUTE): 0.2 10*3/uL (ref 0.0–0.4)
EOS: 2 %
HEMATOCRIT: 42.1 % (ref 37.5–51.0)
HEMOGLOBIN: 13.9 g/dL (ref 13.0–17.7)
IMMATURE GRANULOCYTES: 2 %
Immature Grans (Abs): 0.1 10*3/uL (ref 0.0–0.1)
Lymphocytes Absolute: 3 10*3/uL (ref 0.7–3.1)
Lymphs: 41 %
MCH: 27.8 pg (ref 26.6–33.0)
MCHC: 33 g/dL (ref 31.5–35.7)
MCV: 84 fL (ref 79–97)
MONOCYTES: 9 %
Monocytes Absolute: 0.7 10*3/uL (ref 0.1–0.9)
NEUTROS PCT: 46 %
Neutrophils Absolute: 3.4 10*3/uL (ref 1.4–7.0)
Platelets: 226 10*3/uL (ref 150–379)
RBC: 5 x10E6/uL (ref 4.14–5.80)
RDW: 14 % (ref 12.3–15.4)
WBC: 7.5 10*3/uL (ref 3.4–10.8)

## 2016-03-19 LAB — COMPREHENSIVE METABOLIC PANEL
ALBUMIN: 4.2 g/dL (ref 3.6–4.8)
ALT: 16 IU/L (ref 0–44)
AST: 10 IU/L (ref 0–40)
Albumin/Globulin Ratio: 2 (ref 1.2–2.2)
Alkaline Phosphatase: 60 IU/L (ref 39–117)
BUN / CREAT RATIO: 13 (ref 10–24)
BUN: 12 mg/dL (ref 8–27)
Bilirubin Total: 0.4 mg/dL (ref 0.0–1.2)
CALCIUM: 9.2 mg/dL (ref 8.6–10.2)
CO2: 27 mmol/L (ref 18–29)
Chloride: 98 mmol/L (ref 96–106)
Creatinine, Ser: 0.91 mg/dL (ref 0.76–1.27)
GFR, EST AFRICAN AMERICAN: 103 mL/min/{1.73_m2} (ref >59)
GFR, EST NON AFRICAN AMERICAN: 89 mL/min/{1.73_m2} (ref >59)
GLOBULIN, TOTAL: 2.1 g/dL (ref 1.5–4.5)
Glucose: 172 mg/dL — ABNORMAL HIGH (ref 65–99)
Potassium: 4.3 mmol/L (ref 3.5–5.2)
SODIUM: 142 mmol/L (ref 134–144)
Total Protein: 6.3 g/dL (ref 6.0–8.5)

## 2016-03-19 LAB — LIPID PANEL
CHOL/HDL RATIO: 3.7 ratio (ref 0.0–5.0)
Cholesterol, Total: 139 mg/dL (ref 100–199)
HDL: 38 mg/dL — ABNORMAL LOW (ref >39)
LDL CALC: 71 mg/dL (ref 0–99)
TRIGLYCERIDES: 150 mg/dL — AB (ref 0–149)
VLDL Cholesterol Cal: 30 mg/dL (ref 5–40)

## 2016-03-19 LAB — HIV ANTIBODY (ROUTINE TESTING W REFLEX): HIV Screen 4th Generation wRfx: NONREACTIVE

## 2016-03-19 LAB — HEPATITIS C ANTIBODY: Hep C Virus Ab: <0.1 s/co ratio (ref 0.0–0.9)

## 2016-03-19 LAB — PSA: PROSTATE SPECIFIC AG, SERUM: 0.8 ng/mL (ref 0.0–4.0)

## 2016-03-19 LAB — TSH: TSH: 3.99 u[IU]/mL (ref 0.450–4.500)

## 2016-03-19 LAB — HEMOGLOBIN A1C
Est. average glucose Bld gHb Est-mCnc: 177 mg/dL
Hgb A1c MFr Bld: 7.8 % — ABNORMAL HIGH (ref 4.8–5.6)

## 2016-03-19 NOTE — Progress Notes (Signed)
Advised  ED 

## 2016-04-05 ENCOUNTER — Ambulatory Visit (INDEPENDENT_AMBULATORY_CARE_PROVIDER_SITE_OTHER): Payer: BC Managed Care – PPO | Admitting: Family Medicine

## 2016-04-05 ENCOUNTER — Encounter: Payer: Self-pay | Admitting: Family Medicine

## 2016-04-05 VITALS — BP 128/78 | HR 92 | Temp 98.4°F | Resp 16 | Wt 162.2 lb

## 2016-04-05 DIAGNOSIS — L03114 Cellulitis of left upper limb: Secondary | ICD-10-CM | POA: Diagnosis not present

## 2016-04-05 MED ORDER — DOXYCYCLINE HYCLATE 100 MG PO TABS: 100.0000 mg | ORAL_TABLET | Freq: Two times a day (BID) | ORAL | 0 refills | Status: DC

## 2016-04-05 NOTE — Progress Notes (Signed)
Patient: Ryan Chambers Male    DOB: 10-10-1950   66 y.o.   MRN: [id number] Visit Date: 04/05/2016  Today's Provider: Dortha Kernennis Shakirah Kirkey, PA   Chief Complaint  Patient presents with  . Hand Pain   Subjective:    Hand Pain   Incident onset: unknown. Patine noticed area this morning. The injury mechanism is unknown. Pain location: left hand at middle finger. Quality: soreness. Associated symptoms comments: Redness, swelling, and warm to the touch . He has tried nothing for the symptoms.   Patient Active Problem List   Diagnosis Date Noted  . HLD (hyperlipidemia) 03/09/2015  . Hypertriglyceridemia 03/09/2015  . Amnesia 03/09/2015  . Fast heart beat 03/09/2015  . Acid reflux 01/17/2015  . Allergic rhinitis 01/17/2015  . Family history of malignant neoplasm of prostate 01/17/2015  . Abnormal findings in stool 06/23/2008  . DM type 2 (diabetes mellitus, type 2) (HCC) 08/13/2006  . BP (high blood pressure) 08/13/2006   Past Surgical History:  Procedure Laterality Date  . NO PAST SURGERIES     Family History  Problem Relation Age of Onset  . Prostate cancer Father   . Healthy Sister   . Atrial fibrillation Mother    No Known Allergies   Previous Medications   ASPIRIN 81 MG TABLET    Take by mouth.   FLUTICASONE (FLONASE ALLERGY RELIEF) 50 MCG/ACT NASAL SPRAY    Place into the nose. Reported on 06/16/2015   GLUCOSE BLOOD (FREESTYLE LITE) TEST STRIP    1 each by Other route daily. Use as instructed   JANUVIA 100 MG TABLET    TAKE 1 TABLET (100 MG TOTAL) BY MOUTH DAILY.   LOSARTAN (COZAAR) 50 MG TABLET    Take 1 tablet (50 mg total) by mouth daily.   METFORMIN (GLUCOPHAGE) 1000 MG TABLET    Take 1 tablet (1,000 mg total) by mouth 2 (two) times daily.   MULTIPLE VITAMIN PO    Take by mouth.   OMEPRAZOLE (PRILOSEC) 20 MG CAPSULE    Take 1 capsule (20 mg total) by mouth 2 (two) times daily before a meal.   PRAVASTATIN (PRAVACHOL) 20 MG TABLET    Take 1 tablet (20 mg total) by mouth every  evening.   ZINC-VITAMIN C (ZINC + VITAMIN C) 20-120 MG LOZG    Use as directed in the mouth or throat.    Review of Systems  Constitutional: Negative.   Respiratory: Negative.   Cardiovascular: Negative.   Skin:       Swelling, redness, and warm to the touch     Social History  Substance Use Topics  . Smoking status: Never Smoker  . Smokeless tobacco: Never Used  . Alcohol use No   Objective:   BP 128/78 (BP Location: Right Arm, Patient Position: Sitting, Cuff Size: Normal)   Pulse 92   Temp 98.4 F (36.9 C) (Oral)   Resp 16   Wt 162 lb 3.2 oz (73.6 kg)   SpO2 97%   BMI 24.66 kg/m   Physical Exam  Constitutional: He is oriented to person, place, and time. He appears well-developed and well-nourished. No distress.  HENT:  Head: Normocephalic and atraumatic.  Right Ear: Hearing normal.  Left Ear: Hearing normal.  Nose: Nose normal.  Eyes: Conjunctivae and lids are normal. Right eye exhibits no discharge. Left eye exhibits no discharge. No scleral icterus.  Pulmonary/Chest: Effort normal. No respiratory distress.  Musculoskeletal: Normal range of motion.  Neurological: He is alert and oriented to  person, place, and time.  Skin: Skin is intact. No lesion and no rash noted. There is erythema.  Swelling over the dorsum of the 3rd MCP joint of the left hand. Surrounding erythema with central 2-3 mm scab.  Psychiatric: He has a normal mood and affect. His speech is normal and behavior is normal. Thought content normal.      Assessment & Plan:     1. Cellulitis of left hand Onset today without specific injury known. Recommend hot Epsom [place] soaks and antibiotic. Recheck in a week if no better. Last tetanus 06-03-11. - doxycycline (VIBRA-TABS) 100 MG tablet; Take 1 tablet (100 mg total) by mouth 2 (two) times daily.  Dispense: 20 tablet; Refill: 0

## 2016-04-15 ENCOUNTER — Ambulatory Visit (INDEPENDENT_AMBULATORY_CARE_PROVIDER_SITE_OTHER): Payer: BC Managed Care – PPO | Admitting: Family Medicine

## 2016-04-15 ENCOUNTER — Encounter: Payer: Self-pay | Admitting: Family Medicine

## 2016-04-15 VITALS — BP 148/78 | HR 86 | Temp 97.7°F | Ht 69.0 in | Wt 162.0 lb

## 2016-04-15 DIAGNOSIS — M79642 Pain in left hand: Secondary | ICD-10-CM

## 2016-04-15 DIAGNOSIS — I10 Essential (primary) hypertension: Secondary | ICD-10-CM | POA: Diagnosis not present

## 2016-04-15 NOTE — Assessment & Plan Note (Signed)
Patient with left third MCP joint pain and swelling. He is status post treatment with doxycycline for infection and the swelling and the rest of his hand has improved. His third MCP joint appears to be quite swollen and mildly tender currently. He is neurovascularly intact. It is difficult to tell if it is just soft tissue swelling or bony enlargement as well. Symptoms could've been related to gout or infection. Given persistently enlarged third MCP joint we will get him set up for an appointment with his orthopedic's office this afternoon for evaluation by a hand surgeon. This was scheduled for the patient prior to him leaving the office.

## 2016-04-15 NOTE — Assessment & Plan Note (Signed)
Patient with elevated blood pressure today. Appeared well controlled previously. He is on losartan. We will continue to monitor this and make no changes today. If still elevated at his next visit in about a month we will consider increasing his medication.

## 2016-04-15 NOTE — Patient Instructions (Addendum)
Nice to meet you. We will get you in to see orthopedics for your hand. If you have increased swelling, pain, redness, fevers, or any new or changing symptoms please seek medical attention.

## 2016-04-15 NOTE — Progress Notes (Signed)
Pre visit review using our clinic review tool, if applicable. No additional management support is needed unless otherwise documented below in the visit note. 

## 2016-04-15 NOTE — Progress Notes (Signed)
Marikay AlarEric Cristine Daw, MD Phone: [phone]  Ryan Chambers is a 66 y.o. male who presents today for new patient visit.  Patient was seen by his former PCP 10 days ago and diagnosed with cellulitis of the left hand. He notes it started with swelling in the left third MCP joint and then the rest of his hand became swollen as well. Notes it was red and warm and painful. They placed him on doxycycline and he notes the swelling of his hand has gone down though the third MCP joint remains swollen. 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 He did note initial fevers though none since the first day. Patient does have a history of diabetes. No history of gout.  Elevated blood pressure: Notes it is not usually elevated. He does take losartan. He notes no chest pain or shortness of breath.  Active Ambulatory Problems    Diagnosis Date Noted  . DM type 2 (diabetes mellitus, type 2) (HCC) 08/13/2006  . Acid reflux 01/17/2015  . Allergic rhinitis 01/17/2015  . Family history of malignant neoplasm of prostate 01/17/2015  . Abnormal findings in stool 06/23/2008  . BP (high blood pressure) 08/13/2006  . HLD (hyperlipidemia) 03/09/2015  . Hypertriglyceridemia 03/09/2015  . Amnesia 03/09/2015  . Fast heart beat 03/09/2015  . Left hand pain 04/15/2016   Resolved Ambulatory Problems    Diagnosis Date Noted  . Annual physical exam 03/11/2016   Past Medical History:  Diagnosis Date  . Allergy   . GERD (gastroesophageal reflux disease)   . Hyperlipidemia   . Hypertension     Family History  Problem Relation Age of Onset  . Prostate cancer Father   . Healthy Sister   . Atrial fibrillation Mother     Social History   Social History  . Marital status: Married    Spouse name: Olegario MessierKathy  . Number of children: 3  . Years of education: college   Occupational History  .  Other    Self Employed- Scientist, [employer]   Social History Main Topics  . Smoking status:  Never Smoker  . Smokeless tobacco: Never Used  . Alcohol use No  . Drug use: No  . Sexual activity: Yes   Other Topics Concern  . Not on file   Social History Narrative  . No narrative on file    ROS see history of present illness   Objective  Physical Exam Vitals:   04/15/16 0829 04/15/16 0837  BP: (!) 156/76 (!) 148/78  Pulse: 86   Temp: 97.7 F (36.5 C)     BP Readings from Last 3 Encounters:  04/15/16 (!) 148/78  04/05/16 128/78  03/11/16 138/78   Wt Readings from Last 3 Encounters:  04/15/16 162 lb (73.5 kg)  04/05/16 162 lb 3.2 oz (73.6 kg)  03/11/16 161 lb 6.4 oz (73.2 kg)    Physical Exam  Constitutional: No distress.  Cardiovascular: Normal rate, regular rhythm and normal heart sounds.   Pulmonary/Chest: Effort normal and breath sounds normal.  Musculoskeletal: He exhibits no edema.  Left third MCP joint with possible bony enlargement and soft tissue swelling, there is no erythema, there is no fluctuance, there is no induration, there is mild tenderness of this joint, there is no surrounding swelling, 5 out of 5 grip strength bilaterally, capillary refill less than 2 seconds bilaterally, sensation light touch intact in bilateral hands, right hand with no joint enlargement or tenderness  Neurological: He is  alert. Gait normal.  Skin: He is not diaphoretic.     Assessment/Plan:   Left hand pain Patient with left third MCP joint pain and swelling. He is status post treatment with doxycycline for infection and the swelling and the rest of his hand has improved. His third MCP joint appears to be quite swollen and mildly tender currently. He is neurovascularly intact. It is difficult to tell if it is just soft tissue swelling or bony enlargement as well. Symptoms could've been related to gout or infection. Given persistently enlarged third MCP joint we will get him set up for an appointment with his orthopedic's office this afternoon for evaluation by a hand  surgeon. This was scheduled for the patient prior to him leaving the office.  BP (high blood pressure) Patient with elevated blood pressure today. Appeared well controlled previously. He is on losartan. We will continue to monitor this and make no changes today. If still elevated at his next visit in about a month we will consider increasing his medication.   No orders of the defined types were placed in this encounter.   No orders of the defined types were placed in this encounter.    Marikay Alar, MD [facility]

## 2016-05-20 ENCOUNTER — Other Ambulatory Visit: Payer: Self-pay | Admitting: Family Medicine

## 2016-05-20 DIAGNOSIS — E785 Hyperlipidemia, unspecified: Secondary | ICD-10-CM

## 2016-05-20 MED ORDER — PRAVASTATIN SODIUM 20 MG PO TABS: 20.0000 mg | ORAL_TABLET | Freq: Every evening | ORAL | 2 refills | Status: DC

## 2016-05-27 ENCOUNTER — Other Ambulatory Visit: Payer: Self-pay | Admitting: Family Medicine

## 2016-05-27 DIAGNOSIS — E119 Type 2 diabetes mellitus without complications: Secondary | ICD-10-CM

## 2016-05-27 NOTE — Telephone Encounter (Signed)
Pt needs refill on his   metFORMIN (GLUCOPHAGE) 1000 MG tablet  Taking 06/16/15 -- Lorie Phenix, MD   Take 1 tablet (1,000 mg total) by mouth 2 (two) times daily.   MULTIPLE VITAMIN PO          Thanks  teri

## 2016-05-28 MED ORDER — METFORMIN HCL 1000 MG PO TABS: 1000.0000 mg | ORAL_TABLET | Freq: Two times a day (BID) | ORAL | 2 refills | Status: DC

## 2016-05-28 NOTE — Telephone Encounter (Signed)
Refilled medication. Remind patient he is due for follow up appointment to assess diabetes control in the next 3-4 weeks.

## 2016-05-28 NOTE — Telephone Encounter (Signed)
Patient advised. He states he will call back to schedule a follow up appointment.  °

## 2016-05-30 ENCOUNTER — Other Ambulatory Visit: Payer: Self-pay

## 2016-05-30 ENCOUNTER — Telehealth: Payer: Self-pay | Admitting: Family Medicine

## 2016-05-30 DIAGNOSIS — E119 Type 2 diabetes mellitus without complications: Secondary | ICD-10-CM

## 2016-05-30 MED ORDER — METFORMIN HCL 1000 MG PO TABS: 1000.0000 mg | ORAL_TABLET | Freq: Two times a day (BID) | ORAL | 2 refills | Status: DC

## 2016-05-30 NOTE — Telephone Encounter (Signed)
Done  ED 

## 2016-05-30 NOTE — Telephone Encounter (Signed)
Pt called saying his metformin should have been sent to [facility] at [address].   He has been looking for it and [facility]  [facility]  Thanks  Barth Kirks

## 2016-06-14 ENCOUNTER — Ambulatory Visit: Payer: Self-pay | Admitting: Family Medicine

## 2016-06-17 ENCOUNTER — Other Ambulatory Visit: Payer: Self-pay | Admitting: Family Medicine

## 2016-06-17 DIAGNOSIS — K219 Gastro-esophageal reflux disease without esophagitis: Secondary | ICD-10-CM

## 2016-06-17 MED ORDER — OMEPRAZOLE 20 MG PO CPDR: 20.0000 mg | DELAYED_RELEASE_CAPSULE | Freq: Two times a day (BID) | ORAL | 2 refills | Status: DC

## 2016-06-17 NOTE — Telephone Encounter (Signed)
[facility] following medication.  Thanks CC  omeprazole (PRILOSEC) 20 MG capsule  Take 1 capsule ( 20 MG Total ) by mouth 2 times daily before a meal.

## 2016-06-17 NOTE — Telephone Encounter (Signed)
RX sent to pharmacy  

## 2016-06-23 ENCOUNTER — Other Ambulatory Visit: Payer: Self-pay | Admitting: Family Medicine

## 2016-06-23 DIAGNOSIS — I10 Essential (primary) hypertension: Secondary | ICD-10-CM

## 2016-06-24 ENCOUNTER — Telehealth: Payer: Self-pay | Admitting: Family Medicine

## 2016-06-24 NOTE — Telephone Encounter (Signed)
Pt wants to make an appt with Maurine Ministerennis.  [facility] [address] [facility] for an acute visit.  He said he did not intend to change form Maurine MinisterDennis but couldn't get an appt here.  He told Marcelino DusterMichelle that he just wanted to try out another Dr. ???  I did explain once he left our practice we did not usually take someone back.  He ask me to ask Maurine MinisterDennis if he would make an exception.  He said he needs some refills and they can not see him until June....  Please advise?  Pt's call back is  [phone]  Thank steri

## 2016-06-24 NOTE — Telephone Encounter (Signed)
Refilled Losartan early this morning and other medications in April with additional refills. He needs to [facility] with [facility] [address]

## 2016-06-24 NOTE — Telephone Encounter (Signed)
Patient advised.

## 2016-06-24 NOTE — Telephone Encounter (Signed)
LMTCB

## 2016-08-02 ENCOUNTER — Ambulatory Visit (INDEPENDENT_AMBULATORY_CARE_PROVIDER_SITE_OTHER): Payer: BC Managed Care – PPO | Admitting: Family Medicine

## 2016-08-02 ENCOUNTER — Encounter: Payer: Self-pay | Admitting: Family Medicine

## 2016-08-02 VITALS — BP 150/84 | HR 91 | Temp 98.1°F | Ht 69.0 in | Wt 157.8 lb

## 2016-08-02 DIAGNOSIS — E782 Mixed hyperlipidemia: Secondary | ICD-10-CM

## 2016-08-02 DIAGNOSIS — I10 Essential (primary) hypertension: Secondary | ICD-10-CM

## 2016-08-02 DIAGNOSIS — M79642 Pain in left hand: Secondary | ICD-10-CM

## 2016-08-02 DIAGNOSIS — E119 Type 2 diabetes mellitus without complications: Secondary | ICD-10-CM

## 2016-08-02 LAB — COMPREHENSIVE METABOLIC PANEL
ALBUMIN: 4.4 g/dL (ref 3.5–5.2)
ALT: 17 U/L (ref 0–53)
AST: 19 U/L (ref 0–37)
Alkaline Phosphatase: 58 U/L (ref 39–117)
BILIRUBIN TOTAL: 0.6 mg/dL (ref 0.2–1.2)
BUN: 12 mg/dL (ref 6–23)
CALCIUM: 9.9 mg/dL (ref 8.4–10.5)
CO2: 26 mEq/L (ref 19–32)
CREATININE: 0.94 mg/dL (ref 0.40–1.50)
Chloride: 102 mEq/L (ref 96–112)
GFR: 85.8 mL/min (ref >60.00)
Glucose, Bld: 209 mg/dL — ABNORMAL HIGH (ref 70–99)
Potassium: 4.4 mEq/L (ref 3.5–5.1)
SODIUM: 137 meq/L (ref 135–145)
Total Protein: 6.8 g/dL (ref 6.0–8.3)

## 2016-08-02 LAB — HEMOGLOBIN A1C: HEMOGLOBIN A1C: 7.7 % — AB (ref 4.6–6.5)

## 2016-08-02 NOTE — Progress Notes (Signed)
  Tommi Rumps, MD Phone: [phone]  Ryan Chambers is a 66 y.o. male who presents today for follow-up.  Patient reports area on his left hand improved with antibiotics. He saw a Psychologist, sport and exercise who wanted to do an operation that he ended up seeing a dermatologist who noted the area represented cellulitis in the area has since improved. No issues with her currently.  Elevated blood pressure: Patient reports he is on losartan. He checks his blood pressure at home and it is typically in the 110s/80. He notes no chest pain, shortness breath, or edema.  Diabetes: Does not check his sugars. Takes Januvia and metformin. No polyuria or polydipsia. No hypoglycemia. He saw ophthalmology yesterday.  Hyperlipidemia: Takes pravastatin. No myalgias or right upper quadrant pain.  PMH: nonsmoker.   ROS see history of present illness  Objective  Physical Exam Vitals:   08/02/16 1417  BP: (!) 150/84  Pulse: 91  Temp: 98.1 F (36.7 C)    BP Readings from Last 3 Encounters:  08/02/16 (!) 150/84  04/15/16 (!) 148/78  04/05/16 128/78   Wt Readings from Last 3 Encounters:  08/02/16 157 lb 12.8 oz (71.6 kg)  04/15/16 162 lb (73.5 kg)  04/05/16 162 lb 3.2 oz (73.6 kg)    Physical Exam  Constitutional: No distress.  Cardiovascular: Normal rate, regular rhythm and normal heart sounds.   Pulmonary/Chest: Effort normal and breath sounds normal.  Musculoskeletal: He exhibits no edema.  Neurological: He is alert. Gait normal.  Skin: Skin is warm and dry. He is not diaphoretic.     Assessment/Plan: Please see individual problem list.  BP (high blood pressure) Patient is checking at home. It has been consistently in the normal range per his report. Suspect some measure of white coat hypertension. He'll continue to monitor at home. Gave him a goal of less than 130/80. He'll continue losartan.  DM type 2 (diabetes mellitus, type 2) ([place]) Uncontrolled on most recent check. We'll recheck A1c today.  Continue current medications.  HLD (hyperlipidemia) Tolerating pravastatin. Continue pravastatin.  Left hand pain Issue is resolved. Appears to have been cellulitis. Continue to monitor for recurrence.   Orders Placed This Encounter  Procedures  . Comp Met (CMET)  . HgB A1c   Tommi Rumps, MD [place]

## 2016-08-02 NOTE — Assessment & Plan Note (Signed)
Uncontrolled on most recent check. We'll recheck A1c today. Continue current medications.

## 2016-08-02 NOTE — Patient Instructions (Signed)
Nice to see you. We'll check some lab work today and contact you with results. Please continue your current medication regimen.

## 2016-08-02 NOTE — Assessment & Plan Note (Signed)
Tolerating pravastatin. Continue pravastatin. 

## 2016-08-02 NOTE — Assessment & Plan Note (Signed)
Issue is resolved. Appears to have been cellulitis. Continue to monitor for recurrence.

## 2016-08-02 NOTE — Assessment & Plan Note (Signed)
Patient is checking at home. It has been consistently in the normal range per his report. Suspect some measure of white coat hypertension. He'll continue to monitor at home. Gave him a goal of less than 130/80. He'll continue losartan.

## 2016-08-07 ENCOUNTER — Other Ambulatory Visit: Payer: Self-pay | Admitting: Family Medicine

## 2016-08-07 MED ORDER — EMPAGLIFLOZIN 10 MG PO TABS: 10.0000 mg | ORAL_TABLET | Freq: Every day | ORAL | 3 refills | Status: DC

## 2016-09-23 ENCOUNTER — Encounter: Payer: Self-pay | Admitting: Family Medicine

## 2016-09-23 ENCOUNTER — Ambulatory Visit (INDEPENDENT_AMBULATORY_CARE_PROVIDER_SITE_OTHER): Payer: BC Managed Care – PPO | Admitting: Family Medicine

## 2016-09-23 DIAGNOSIS — R030 Elevated blood-pressure reading, without diagnosis of hypertension: Secondary | ICD-10-CM | POA: Diagnosis not present

## 2016-09-23 DIAGNOSIS — E119 Type 2 diabetes mellitus without complications: Secondary | ICD-10-CM

## 2016-09-23 DIAGNOSIS — K219 Gastro-esophageal reflux disease without esophagitis: Secondary | ICD-10-CM

## 2016-09-23 NOTE — Assessment & Plan Note (Signed)
Chronic issues with this. Discussed appropriate use of Prilosec 30 minutes prior to meals. We'll do this for one month and let us know if there has been no improvement. If symptoms return after treatment we will need to consider GI referral for EGD.

## 2016-09-23 NOTE — Patient Instructions (Addendum)
Nice to see you. Please work on dietary changes as outlined below. Please start taking your Prilosec 30 minutes before breakfast and dinner. If your reflux symptoms don't improve with this please let us know so we can refer you to GI.  Diet Recommendations  Starchy (carb) foods: Bread, rice, pasta, potatoes, corn, cereal, grits, crackers, bagels, muffins, all baked goods.  (Fruits, milk, and yogurt also have carbohydrate, but most of these foods will not spike your blood sugar as the starchy foods will.)  A few fruits do cause high blood sugars; use small portions of bananas (limit to 1/2 at a time), grapes, watermelon, oranges, and most tropical fruits.    Protein foods: Meat, fish, poultry, eggs, dairy foods, and beans such as pinto and kidney beans (beans also provide carbohydrate).   1. Eat at least 3 meals and 1-2 snacks per day. Never go more than 4-5 hours while awake without eating. Eat breakfast within the first hour of getting up.   2. Limit starchy foods to TWO per meal and ONE per snack. ONE portion of a starchy  food is equal to the following:   - ONE slice of bread (or its equivalent, such as half of a hamburger bun).   - 1/2 cup of a "scoopable" starchy food such as potatoes or rice.   - 15 grams of carbohydrate as shown on food label.  3. Include at every meal: a protein food, a carb food, and vegetables and/or fruit.   - Obtain twice the volume of veg's as protein or carbohydrate foods for both lunch and dinner.   - Fresh or frozen veg's are best.   - Keep frozen veg's on hand for a quick vegetable serving.

## 2016-09-23 NOTE — Assessment & Plan Note (Signed)
Suspect white coat hypertension given normal blood pressures at home. He will continue to monitor. If they start to run more elevated to let us know.

## 2016-09-23 NOTE — Progress Notes (Signed)
  Ryan AlarEric Kedar Sedano, MD Phone: [phone]  Ryan Chambers is a 66 y.o. male who presents today for f/u.  DIABETES Disease Monitoring: Blood Sugar ranges-not checking Polyuria/phagia/dipsia- some polyphagia      Optho- saw 1.5 months ago Medications: Compliance- taking januvia, metformin, not taking Jardiance as he was worried about dehydration Hypoglycemic symptoms- no Notes he wants to try diet changes. His diet has not been great over the last year given stresses with his son. He was comfort eating to some degree. He notes this has resolved and he's felt better.  White coat hypertension: Blood pressure at home 110-120/70. He does take losartan. No chest pain or shortness of breath. Has a long history of his blood pressure running high at the doctor's office.  GERD: Taking Prilosec though not taking prior to meals. Mostly gets reflux at night. No blood in the stool. No abdominal pain.   PMH: nonsmoker.   ROS see history of present illness  Objective  Physical Exam Vitals:   09/23/16 1558  BP: (!) 148/72  Pulse: 94  Temp: 97.8 F (36.6 C)    BP Readings from Last 3 Encounters:  09/23/16 (!) 148/72  08/02/16 (!) 150/84  04/15/16 (!) 148/78   Wt Readings from Last 3 Encounters:  09/23/16 160 lb (72.6 kg)  08/02/16 157 lb 12.8 oz (71.6 kg)  04/15/16 162 lb (73.5 kg)    Physical Exam  Constitutional: No distress.  Cardiovascular: Normal rate, regular rhythm and normal heart sounds.   Pulmonary/Chest: Effort normal and breath sounds normal.  Abdominal: Soft. Bowel sounds are normal. He exhibits no distension. There is no tenderness. There is no rebound and no guarding.  Musculoskeletal: He exhibits no edema.  Neurological: He is alert. Gait normal.  Skin: Skin is warm and dry. He is not diaphoretic.     Assessment/Plan: Please see individual problem list.  White coat syndrome without diagnosis of hypertension Suspect white coat hypertension given normal blood  pressures at home. He will continue to monitor. If they start to run more elevated to let us know.  Acid reflux Chronic issues with this. Discussed appropriate use of Prilosec 30 minutes prior to meals. We'll do this for one month and let us know if there has been no improvement. If symptoms return after treatment we will need to consider GI referral for EGD.  DM type 2 (diabetes mellitus, type 2) (HCC) Uncontrolled by recent A1c. Discussed appropriate glucose checks at home fasting. He'll continue his current medications. Advised on dietary changes. Dietary guidelines given. He would like to try dietary changes prior to adding an additional medication.  Ryan AlarEric Joscelynn Brutus, MD [facility] [place]

## 2016-09-23 NOTE — Assessment & Plan Note (Signed)
Uncontrolled by recent A1c. Discussed appropriate glucose checks at home fasting. He'll continue his current medications. Advised on dietary changes. Dietary guidelines given. He would like to try dietary changes prior to adding an additional medication.

## 2016-10-14 ENCOUNTER — Telehealth: Payer: Self-pay | Admitting: Family Medicine

## 2016-10-14 NOTE — Telephone Encounter (Signed)
Error/MW °

## 2016-11-06 ENCOUNTER — Ambulatory Visit: Payer: BC Managed Care – PPO | Admitting: Family Medicine

## 2016-11-29 ENCOUNTER — Ambulatory Visit (INDEPENDENT_AMBULATORY_CARE_PROVIDER_SITE_OTHER): Payer: BC Managed Care – PPO | Admitting: Family Medicine

## 2016-11-29 ENCOUNTER — Encounter: Payer: Self-pay | Admitting: Family Medicine

## 2016-11-29 DIAGNOSIS — Z23 Encounter for immunization: Secondary | ICD-10-CM

## 2016-11-29 DIAGNOSIS — K219 Gastro-esophageal reflux disease without esophagitis: Secondary | ICD-10-CM | POA: Diagnosis not present

## 2016-11-29 DIAGNOSIS — E785 Hyperlipidemia, unspecified: Secondary | ICD-10-CM

## 2016-11-29 DIAGNOSIS — E119 Type 2 diabetes mellitus without complications: Secondary | ICD-10-CM

## 2016-11-29 LAB — POCT GLYCOSYLATED HEMOGLOBIN (HGB A1C): Hemoglobin A1C: 7.2

## 2016-11-29 MED ORDER — PANTOPRAZOLE SODIUM 40 MG PO TBEC: 40.0000 mg | DELAYED_RELEASE_TABLET | Freq: Every day | ORAL | 3 refills | Status: DC

## 2016-11-29 MED ORDER — SITAGLIPTIN PHOSPHATE 100 MG PO TABS: 100.0000 mg | ORAL_TABLET | Freq: Every day | ORAL | 2 refills | Status: DC

## 2016-11-29 MED ORDER — METFORMIN HCL 1000 MG PO TABS: 1000.0000 mg | ORAL_TABLET | Freq: Two times a day (BID) | ORAL | 2 refills | Status: AC

## 2016-11-29 MED ORDER — PRAVASTATIN SODIUM 20 MG PO TABS: 20.0000 mg | ORAL_TABLET | Freq: Every evening | ORAL | 2 refills | Status: DC

## 2016-11-29 NOTE — Assessment & Plan Note (Signed)
Continues to have issues. We'll trial Protonix for the next 30 days. He'll call us and let us know how this is going.

## 2016-11-29 NOTE — Assessment & Plan Note (Signed)
Continue pravastatin 

## 2016-11-29 NOTE — Progress Notes (Signed)
  Marikay Alar, MD Phone: [phone]  Ryan Chambers is a 66 y.o. male who presents today for follow-up.  Diabetes: Typically around 150 though does not check frequently. Taking Januvia. Taking metformin. No polyuria or polydipsia. No hyperglycemia.  Hyperlipidemia: Taking pravastatin. No chest pain. No right upper quadrant pain. No myalgias.  Does note his reflux bothers him at times. He does drink a lot of caffeine. He eats out a lot. Fried food also exacerbates it. Notes he eats late and notes some burning when he does that. The Prilosec does help some. No abdominal pain. No blood in his stool.  PMH: nonsmoker.   ROS see history of present illness  Objective  Physical Exam Vitals:   11/29/16 0847 11/29/16 0917  BP: (!) 150/82 (!) 144/82  Pulse: 83   Temp: 97.7 F (36.5 C)   SpO2: 98%     BP Readings from Last 3 Encounters:  11/29/16 (!) 144/82  09/23/16 (!) 148/72  08/02/16 (!) 150/84   Wt Readings from Last 3 Encounters:  11/29/16 159 lb (72.1 kg)  09/23/16 160 lb (72.6 kg)  08/02/16 157 lb 12.8 oz (71.6 kg)    Physical Exam  Constitutional: No distress.  HENT:  Head: Normocephalic and atraumatic.  Cardiovascular: Normal rate, regular rhythm and normal heart sounds.   Pulmonary/Chest: Effort normal and breath sounds normal.  Abdominal: Soft. Bowel sounds are normal. He exhibits no distension. There is no tenderness. There is no rebound.  Neurological: He is alert. Gait normal.  Skin: He is not diaphoretic.     Assessment/Plan: Please see individual problem list.  Acid reflux Continues to have issues. We'll trial Protonix for the next 30 days. He'll call us and let us know how this is going.  DM type 2 (diabetes mellitus, type 2) (HCC) Improved control. He'll continue his current medications. Discussed dietary changes and exercise to help get him into goal range. Recheck in 3 months.  HLD (hyperlipidemia) Continue pravastatin.   Orders Placed This  Encounter  Procedures  . Flu Vaccine QUAD 36+ mos IM  . POCT HgB A1C    Meds ordered this encounter  Medications  . pantoprazole (PROTONIX) 40 MG tablet    Sig: Take 1 tablet (40 mg total) by mouth daily.    Dispense:  30 tablet    Refill:  3  . sitaGLIPtin (JANUVIA) 100 MG tablet    Sig: Take 1 tablet (100 mg total) by mouth daily.    Dispense:  90 tablet    Refill:  2  . metFORMIN (GLUCOPHAGE) 1000 MG tablet    Sig: Take 1 tablet (1,000 mg total) by mouth 2 (two) times daily.    Dispense:  180 tablet    Refill:  2  . pravastatin (PRAVACHOL) 20 MG tablet    Sig: Take 1 tablet (20 mg total) by mouth every evening.    Dispense:  90 tablet    Refill:  2    Marikay Alar, MD [facility]

## 2016-11-29 NOTE — Assessment & Plan Note (Signed)
Improved control. He'll continue his current medications. Discussed dietary changes and exercise to help get him into goal range. Recheck in 3 months.

## 2016-11-29 NOTE — Patient Instructions (Signed)
Nice to see you. I refilled your medications. I have prescribed Protonix for your reflux. Please use this 30 minutes before breakfast and try to change her diet. Please contact us in one month to let us know how your reflux is doing.

## 2017-03-13 ENCOUNTER — Encounter (INDEPENDENT_AMBULATORY_CARE_PROVIDER_SITE_OTHER): Payer: Self-pay

## 2017-03-13 ENCOUNTER — Ambulatory Visit: Payer: BC Managed Care – PPO | Admitting: Family Medicine

## 2017-03-13 ENCOUNTER — Encounter: Payer: Self-pay | Admitting: Family Medicine

## 2017-03-13 ENCOUNTER — Other Ambulatory Visit: Payer: Self-pay

## 2017-03-13 VITALS — BP 160/82 | HR 96 | Temp 97.7°F | Wt 166.4 lb

## 2017-03-13 DIAGNOSIS — K219 Gastro-esophageal reflux disease without esophagitis: Secondary | ICD-10-CM | POA: Diagnosis not present

## 2017-03-13 DIAGNOSIS — Z125 Encounter for screening for malignant neoplasm of prostate: Secondary | ICD-10-CM

## 2017-03-13 DIAGNOSIS — E782 Mixed hyperlipidemia: Secondary | ICD-10-CM

## 2017-03-13 DIAGNOSIS — E781 Pure hyperglyceridemia: Secondary | ICD-10-CM

## 2017-03-13 DIAGNOSIS — E119 Type 2 diabetes mellitus without complications: Secondary | ICD-10-CM

## 2017-03-13 DIAGNOSIS — M7540 Impingement syndrome of unspecified shoulder: Secondary | ICD-10-CM

## 2017-03-13 DIAGNOSIS — I10 Essential (primary) hypertension: Secondary | ICD-10-CM | POA: Diagnosis not present

## 2017-03-13 NOTE — Assessment & Plan Note (Signed)
Well-controlled at home.  Continue losartan.  He will return for labs.

## 2017-03-13 NOTE — Patient Instructions (Signed)
Nice to see you. We will have you return for fasting lab work in 1 week. Please do the exercises for your shoulders. Please start working on dietary changes.   Shoulder Impingement Syndrome Rehab Ask your health care provider which exercises are safe for you. Do exercises exactly as told by your health care provider and adjust them as directed. It is normal to feel mild stretching, pulling, tightness, or discomfort as you do these exercises, but you should stop right away if you feel sudden pain or your pain gets worse.Do not begin these exercises until told by your health care provider. Stretching and range of motion exercise This exercise warms up your muscles and joints and improves the movement and flexibility of your shoulder. This exercise also helps to relieve pain and stiffness. Exercise A: Passive horizontal adduction  1. Sit or stand and pull your left / right elbow across your chest, toward your other shoulder. Stop when you feel a gentle stretch in the back of your shoulder and upper arm. ? Keep your arm at shoulder height. ? Keep your arm as close to your body as you comfortably can. 2. Hold for __________ seconds. 3. Slowly return to the starting position. Repeat __________ times. Complete this exercise __________ times a day. Strengthening exercises These exercises build strength and endurance in your shoulder. Endurance is the ability to use your muscles for a long time, even after they get tired. Exercise B: External rotation, isometric 1. Stand or sit in a doorway, facing the door frame. 2. Bend your left / right elbow and place the back of your wrist against the door frame. Only your wrist should be touching the frame. Keep your upper arm at your side. 3. Gently press your wrist against the door frame, as if you are trying to push your arm away from your abdomen. ? Avoid shrugging your shoulder while you press your hand against the door frame. Keep your shoulder blade  tucked down toward the middle of your back. 4. Hold for __________ seconds. 5. Slowly release the tension, and relax your muscles completely before you do the exercise again. Repeat __________ times. Complete this exercise __________ times a day. Exercise C: Internal rotation, isometric  1. Stand or sit in a doorway, facing the door frame. 2. Bend your left / right elbow and place the inside of your wrist against the door frame. Only your wrist should be touching the frame. Keep your upper arm at your side. 3. Gently press your wrist against the door frame, as if you are trying to push your arm toward your abdomen. ? Avoid shrugging your shoulder while you press your hand against the door frame. Keep your shoulder blade tucked down toward the middle of your back. 4. Hold for __________ seconds. 5. Slowly release the tension, and relax your muscles completely before you do the exercise again. Repeat __________ times. Complete this exercise __________ times a day. Exercise D: Scapular protraction, supine  1. Lie on your back on a firm surface. Hold a __________ weight in your left / right hand. 2. Raise your left / right arm straight into the air so your hand is directly above your shoulder joint. 3. Push the weight into the air so your shoulder lifts off of the surface that you are lying on. Do not move your head, neck, or back. 4. Hold for __________ seconds. 5. Slowly return to the starting position. Let your muscles relax completely before you repeat this exercise. Repeat __________  times. Complete this exercise __________ times a day. Exercise E: Scapular retraction  1. Sit in a stable chair without armrests, or stand. 2. Secure an exercise band to a stable object in front of you so the band is at shoulder height. 3. Hold one end of the exercise band in each hand. Your palms should face down. 4. Squeeze your shoulder blades together and move your elbows slightly behind you. Do not shrug  your shoulders while you do this. 5. Hold for __________ seconds. 6. Slowly return to the starting position. Repeat __________ times. Complete this exercise __________ times a day. Exercise F: Shoulder extension  1. Sit in a stable chair without armrests, or stand. 2. Secure an exercise band to a stable object in front of you where the band is above shoulder height. 3. Hold one end of the exercise band in each hand. 4. Straighten your elbows and lift your hands up to shoulder height. 5. Squeeze your shoulder blades together and pull your hands down to the sides of your thighs. Stop when your hands are straight down by your sides. Do not let your hands go behind your body. 6. Hold for __________ seconds. 7. Slowly return to the starting position. Repeat __________ times. Complete this exercise __________ times a day. This information is not intended to replace advice given to you by your health care provider. Make sure you discuss any questions you have with your health care provider. Document Released: 02/04/2005 Document Revised: 10/12/2015 Document Reviewed: 01/07/2015 Elsevier Interactive Patient Education  [employer].

## 2017-03-13 NOTE — Progress Notes (Signed)
  Tommi Rumps, MD Phone: [phone]  Ryan Chambers is a 67 y.o. male who presents today for follow-up.  Hypertension: Typically normal at home 68.  Taking losartan.  No chest pain, shortness of breath, or edema.  Diabetes: Typically around 150 when he does check it.  Taking metformin and Januvia.  No polyuria or polydipsia.  No hypoglycemia.  He is due to see ophthalmology in March.  He is going to start working on dietary changes.  He has not been exercising very much recently.  Hyperlipidemia: Taking pravastatin.  No right upper quadrant pain.  No myalgias.  Bilateral shoulder pain: Notes this bothers him mostly when he moves to put his jacket on or reach behind him.  Occasionally at night.  No injuries.  GERD: Well-controlled on Protonix.  Very rare symptoms now.  Uses a wedge to help.  Social History   Tobacco Use  Smoking Status Never Smoker  Smokeless Tobacco Never Used     ROS see history of present illness  Objective  Physical Exam Vitals:   03/13/17 0909  BP: (!) 160/82  Pulse: 96  Temp: 97.7 F (36.5 C)  SpO2: 98%    BP Readings from Last 3 Encounters:  03/13/17 (!) 160/82  11/29/16 (!) 144/82  09/23/16 (!) 148/72   Wt Readings from Last 3 Encounters:  03/13/17 166 lb 6.4 oz (75.5 kg)  11/29/16 159 lb (72.1 kg)  09/23/16 160 lb (72.6 kg)    Physical Exam  Constitutional: No distress.  Cardiovascular: Normal rate, regular rhythm and normal heart sounds.  Pulmonary/Chest: Effort normal and breath sounds normal.  Abdominal: Soft. Bowel sounds are normal. He exhibits no distension. There is no tenderness.  Musculoskeletal: He exhibits no edema.  Bilateral shoulders with no tenderness, they both have full range of motion with minimal discomfort on internal active and passive rotation, slight discomfort on empty can testing bilaterally  Neurological: He is alert. Gait normal.  Skin: Skin is warm and dry. He is not diaphoretic.     Assessment/Plan:  Please see individual problem list.  DM type 2 (diabetes mellitus, type 2) ([place]) Patient will return for an A1c.  He will continue his current regimen.  Advised to see ophthalmology.  Acid reflux Well-controlled now.  Continue Protonix.  HTN (hypertension) Well-controlled at home.  Continue losartan.  He will return for labs.  HLD (hyperlipidemia) Continue pravastatin.  Return for lipid panel.  Rotator cuff impingement syndrome Shoulder pain seems consistent with mild rotator cuff impingement.  Discussed exercises for home and if not improving letting us know.   Orders Placed This Encounter  Procedures  . HgB A1c    Standing Status:   Future    Standing Expiration Date:   03/13/2018  . Comp Met (CMET)    Standing Status:   Future    Standing Expiration Date:   03/13/2018  . Lipid panel    Standing Status:   Future    Standing Expiration Date:   03/13/2018  . PSA    Standing Status:   Future    Standing Expiration Date:   03/13/2018    No orders of the defined types were placed in this encounter.    Tommi Rumps, MD [place]

## 2017-03-13 NOTE — Assessment & Plan Note (Signed)
Continue pravastatin.  Return for lipid panel.

## 2017-03-13 NOTE — Assessment & Plan Note (Signed)
Patient will return for an A1c.  He will continue his current regimen.  Advised to see ophthalmology.

## 2017-03-13 NOTE — Assessment & Plan Note (Signed)
Well-controlled now.  Continue Protonix.

## 2017-03-13 NOTE — Assessment & Plan Note (Signed)
Shoulder pain seems consistent with mild rotator cuff impingement.  Discussed exercises for home and if not improving letting us know.

## 2017-03-25 ENCOUNTER — Other Ambulatory Visit (INDEPENDENT_AMBULATORY_CARE_PROVIDER_SITE_OTHER): Payer: BC Managed Care – PPO

## 2017-03-25 DIAGNOSIS — E782 Mixed hyperlipidemia: Secondary | ICD-10-CM

## 2017-03-25 DIAGNOSIS — E119 Type 2 diabetes mellitus without complications: Secondary | ICD-10-CM | POA: Diagnosis not present

## 2017-03-25 DIAGNOSIS — Z125 Encounter for screening for malignant neoplasm of prostate: Secondary | ICD-10-CM | POA: Diagnosis not present

## 2017-03-25 LAB — LIPID PANEL
CHOL/HDL RATIO: 3
Cholesterol: 133 mg/dL (ref 0–200)
HDL: 41.5 mg/dL (ref >39.00)
LDL CALC: 61 mg/dL (ref 0–99)
NONHDL: 91.56
Triglycerides: 152 mg/dL — ABNORMAL HIGH (ref 0.0–149.0)
VLDL: 30.4 mg/dL (ref 0.0–40.0)

## 2017-03-25 LAB — COMPREHENSIVE METABOLIC PANEL
ALK PHOS: 54 U/L (ref 39–117)
ALT: 14 U/L (ref 0–53)
AST: 13 U/L (ref 0–37)
Albumin: 4.1 g/dL (ref 3.5–5.2)
BILIRUBIN TOTAL: 0.4 mg/dL (ref 0.2–1.2)
BUN: 11 mg/dL (ref 6–23)
CALCIUM: 9.2 mg/dL (ref 8.4–10.5)
CO2: 29 mEq/L (ref 19–32)
CREATININE: 0.97 mg/dL (ref 0.40–1.50)
Chloride: 103 mEq/L (ref 96–112)
GFR: 82.58 mL/min (ref >60.00)
Glucose, Bld: 213 mg/dL — ABNORMAL HIGH (ref 70–99)
POTASSIUM: 4.3 meq/L (ref 3.5–5.1)
Sodium: 140 mEq/L (ref 135–145)
Total Protein: 6.2 g/dL (ref 6.0–8.3)

## 2017-03-25 LAB — PSA: PSA: 1.71 ng/mL (ref 0.10–4.00)

## 2017-03-25 LAB — HEMOGLOBIN A1C: Hgb A1c MFr Bld: 8.4 % — ABNORMAL HIGH (ref 4.6–6.5)

## 2017-03-30 ENCOUNTER — Other Ambulatory Visit: Payer: Self-pay | Admitting: Family Medicine

## 2017-03-30 MED ORDER — EMPAGLIFLOZIN 10 MG PO TABS: 10.0000 mg | ORAL_TABLET | Freq: Every day | ORAL | 3 refills | Status: DC

## 2017-04-01 ENCOUNTER — Telehealth: Payer: Self-pay | Admitting: Family Medicine

## 2017-04-01 MED ORDER — PANTOPRAZOLE SODIUM 40 MG PO TBEC: 40.0000 mg | DELAYED_RELEASE_TABLET | Freq: Every day | ORAL | 3 refills | Status: AC

## 2017-04-01 NOTE — Telephone Encounter (Signed)
Please disregard - pt got the name of the medication incorrect.  Pt does not need this refilled.

## 2017-04-01 NOTE — Telephone Encounter (Signed)
Copied from CRM [id number]. Topic: Quick Communication - Rx Refill/Question >> Apr 01, 2017  2:18 PM Landry MellowFoltz, Melissa J wrote: Medication: protonix    Has the patient contacted their pharmacy? Yes.     (Agent: If no, request that the patient contact the pharmacy for the refill.)   Preferred Pharmacy (with phone number or street name): [facility] [address]    Agent: Please be advised that RX refills may take up to 3 business days. We ask that you follow-up with your pharmacy.

## 2017-04-01 NOTE — Telephone Encounter (Signed)
Copied from CRM [phone]. Topic: Quick Communication - Rx Refill/Question >> Apr 01, 2017  2:16 PM Landry MellowFoltz, Melissa J wrote: Medication: sitaGLIPtin (JANUVIA) 100 MG tablet   Has the patient contacted their pharmacy? No.   (Agent: If no, request that the patient contact the pharmacy for the refill.)   Preferred Pharmacy (with phone number or street name): [facility] [address]  Pharmacy does not have this yet   Agent: Please be advised that RX refills may take up to 3 business days. We ask that you follow-up with your pharmacy.

## 2017-06-29 ENCOUNTER — Other Ambulatory Visit: Payer: Self-pay | Admitting: Family Medicine

## 2017-06-29 DIAGNOSIS — I10 Essential (primary) hypertension: Secondary | ICD-10-CM

## 2017-07-03 ENCOUNTER — Other Ambulatory Visit: Payer: Self-pay | Admitting: Family Medicine

## 2017-07-03 DIAGNOSIS — K219 Gastro-esophageal reflux disease without esophagitis: Secondary | ICD-10-CM

## 2017-09-10 ENCOUNTER — Ambulatory Visit: Payer: BC Managed Care – PPO | Admitting: Family Medicine

## 2017-09-20 ENCOUNTER — Other Ambulatory Visit: Payer: Self-pay | Admitting: Family Medicine

## 2017-09-20 DIAGNOSIS — E119 Type 2 diabetes mellitus without complications: Secondary | ICD-10-CM

## 2017-11-16 ENCOUNTER — Other Ambulatory Visit: Payer: Self-pay | Admitting: Family Medicine

## 2017-11-16 DIAGNOSIS — E785 Hyperlipidemia, unspecified: Secondary | ICD-10-CM

## 2018-02-20 ENCOUNTER — Other Ambulatory Visit
Admission: RE | Admit: 2018-02-20 | Discharge: 2018-02-20 | Disposition: A | Discharge status: Home / Self Care | Payer: Medicare Other | Source: Ambulatory Visit | Attending: Student | Admitting: Student

## 2018-02-20 DIAGNOSIS — M7022 Olecranon bursitis, left elbow: Secondary | ICD-10-CM | POA: Diagnosis present

## 2018-02-20 LAB — SYNOVIAL CELL COUNT + DIFF, W/ CRYSTALS
Crystals, Fluid: NONE SEEN
Eosinophils-Synovial: 0 %
Lymphocytes-Synovial Fld: 16 %
Monocyte-Macrophage-Synovial Fluid: 0 %
Neutrophil, Synovial: 84 %
WBC, Synovial: 2979 /mm3 — ABNORMAL HIGH (ref 0–200)

## 2018-05-28 ENCOUNTER — Other Ambulatory Visit: Payer: Self-pay | Admitting: Family Medicine

## 2018-05-28 DIAGNOSIS — E785 Hyperlipidemia, unspecified: Secondary | ICD-10-CM

## 2018-09-23 ENCOUNTER — Encounter: Payer: Medicare Other | Attending: Internal Medicine | Admitting: Dietician

## 2018-09-23 ENCOUNTER — Other Ambulatory Visit: Payer: Self-pay

## 2018-09-23 ENCOUNTER — Encounter: Payer: Self-pay | Admitting: Dietician

## 2018-09-23 VITALS — Ht 69.0 in | Wt 169.1 lb

## 2018-09-23 DIAGNOSIS — E119 Type 2 diabetes mellitus without complications: Secondary | ICD-10-CM | POA: Diagnosis present

## 2018-09-23 NOTE — Progress Notes (Signed)
Medical Nutrition Therapy: Visit start time: 1450  end time: 1555  Assessment:  Diagnosis: Type 2 diabetes Past medical history: HTN, hyperlipidemia, GERD Psychosocial issues/ stress concerns: none  Preferred learning method:  . Auditory . Visual . Hands-on  Current weight: 169.1lbs  Height: 5'9" Medications, supplements: reconciled list in medical record  Progress and evaluation:   Patient reports 15-year history of diabetes; no previous diabetes classes/ RD visits  He is using a Contour Next meter for BG testing at home, some fasting readings and some post-prandial, improving to mid- to low-100s after ranging in 200s prior to starting Ozempic.  He has been working to reduce his intake of starchy foods; does not eat sweets or drink sugary beverages.   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 He denies any episodes of hypoglycemia.   He has maintained his weight at 168-172lbs for several years.    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 Dining out frequency: 14-20 meals per week.  Breakfast: usually [employer] breakfast sandwich or oatmeal, or 1/2 bagel with cream cheese apple; very hungry in am Snack: none or 2-3 graham crackers Lunch: often out -- 1/2 ham and cheese sandwich, chips, unsweet tea  Snack: nuts sometimes Supper: 8/4-- broasted chicken, hush puppies, green beans; smoked [place] sandwich with no chips Snack: none Beverages: water, unsweet tea  Nutrition Care Education: Topics covered: diabetes Basic nutrition: basic food groups, appropriate nutrient balance, appropriate meal and snack schedule, general nutrition guidelines    Diabetes:  appropriate meal and snack schedule, appropriate carb intake and balance, healthy carb choices including benefit of high fiber foods, low glycemic foods vs high glycemic, appropriate food portions;  effects of CHO, protein, and fat on BGs; basic meal planning using plate method; appropriate hypoglycemia treatment; role of physical activity on BGs  Nutritional Diagnosis:  [place]-2.2 Altered nutrition-related laboratory As related to type 2 diabetes.  As evidenced by patient with recent HbA1C of 9.6%.  Intervention:   Instruction and discussion as noted above.  Patient has been working on diet changes to improve BG control.   Established goals for further improvement with direction from patient.   No follow-up scheduled at this time; patient will schedule later if needed.   Education Materials given:  . General diet guidelines for Diabetes . Plate Planner with food lists . Sample meal pattern/ menus . Snacking handout . Goals/ instructions  Learner/ who was taught:  . Patient    Level of understanding: Marland Kitchen Verbalizes/ demonstrates competency  Demonstrated degree of understanding via:   Teach back Learning barriers: . None   Willingness to learn/ readiness for change: . Eager, change in progress   Monitoring and Evaluation:  Dietary intake, exercise, BG control, and body weight      follow up: prn

## 2018-09-23 NOTE — Patient Instructions (Addendum)
   Control portions of carbohydrate foods with meals, generally keep to the size of a fisted hand or a little less.   Make sure to include a protein food with each meal to keep muscle weight and help prevent low blood sugars.   Plan to eat something every 3-4 hours, especially when doing physical work. Keep 1-2 snacks on hand that provide some carb and some protein, such as nuts and dry cereal snack mix, fruit with lowfat cheese or nuts or peanut butter; graham crackers or whole grain crackers with cheese or peanut butter.
